# Patient Record
Sex: Female | Born: 1955 | Race: White | Hispanic: No | Marital: Married | Smoking: Former smoker
Health system: Southern US, Community
[De-identification: ages and names within clinical notes are randomized; demographics above are authoritative.]

## PROBLEM LIST (undated history)

## (undated) DIAGNOSIS — M1990 Unspecified osteoarthritis, unspecified site: Secondary | ICD-10-CM

## (undated) DIAGNOSIS — K219 Gastro-esophageal reflux disease without esophagitis: Secondary | ICD-10-CM

## (undated) DIAGNOSIS — M810 Age-related osteoporosis without current pathological fracture: Secondary | ICD-10-CM

## (undated) DIAGNOSIS — R569 Unspecified convulsions: Secondary | ICD-10-CM

## (undated) DIAGNOSIS — F419 Anxiety disorder, unspecified: Secondary | ICD-10-CM

## (undated) DIAGNOSIS — I499 Cardiac arrhythmia, unspecified: Secondary | ICD-10-CM

## (undated) HISTORY — PX: OOPHORECTOMY: SHX86

## (undated) HISTORY — PX: NASAL SINUS SURGERY: SHX719

## (undated) HISTORY — DX: Gastro-esophageal reflux disease without esophagitis: K21.9

## (undated) HISTORY — DX: Age-related osteoporosis without current pathological fracture: M81.0

## (undated) HISTORY — DX: Unspecified convulsions: R56.9

## (undated) HISTORY — DX: Anxiety disorder, unspecified: F41.9

## (undated) HISTORY — DX: Cardiac arrhythmia, unspecified: I49.9

## (undated) HISTORY — PX: OTHER SURGICAL HISTORY: SHX169

## (undated) HISTORY — DX: Unspecified osteoarthritis, unspecified site: M19.90

## (undated) HISTORY — PX: COLONOSCOPY: SHX174

---

## 1996-03-06 HISTORY — PX: ABDOMINAL HYSTERECTOMY: SHX81

## 1998-02-06 ENCOUNTER — Emergency Department (HOSPITAL_COMMUNITY): Admission: EM | Admit: 1998-02-06 | Discharge: 1998-02-06 | Payer: Self-pay

## 2004-03-31 ENCOUNTER — Other Ambulatory Visit: Admission: RE | Admit: 2004-03-31 | Discharge: 2004-03-31 | Payer: Self-pay | Admitting: Gynecology

## 2004-09-09 ENCOUNTER — Encounter (INDEPENDENT_AMBULATORY_CARE_PROVIDER_SITE_OTHER): Payer: Self-pay | Admitting: Specialist

## 2004-09-09 ENCOUNTER — Ambulatory Visit (HOSPITAL_COMMUNITY): Admission: RE | Admit: 2004-09-09 | Discharge: 2004-09-09 | Payer: Self-pay | Admitting: Obstetrics and Gynecology

## 2007-01-20 ENCOUNTER — Encounter: Admission: RE | Admit: 2007-01-20 | Discharge: 2007-01-20 | Payer: Self-pay | Admitting: Orthopedic Surgery

## 2008-12-23 DIAGNOSIS — Z8669 Personal history of other diseases of the nervous system and sense organs: Secondary | ICD-10-CM

## 2008-12-23 DIAGNOSIS — R1031 Right lower quadrant pain: Secondary | ICD-10-CM | POA: Insufficient documentation

## 2008-12-24 ENCOUNTER — Ambulatory Visit: Payer: Self-pay | Admitting: Internal Medicine

## 2008-12-24 DIAGNOSIS — K589 Irritable bowel syndrome without diarrhea: Secondary | ICD-10-CM | POA: Insufficient documentation

## 2008-12-28 ENCOUNTER — Ambulatory Visit: Payer: Self-pay | Admitting: Internal Medicine

## 2008-12-28 ENCOUNTER — Encounter: Payer: Self-pay | Admitting: Internal Medicine

## 2008-12-29 ENCOUNTER — Encounter: Payer: Self-pay | Admitting: Internal Medicine

## 2008-12-30 ENCOUNTER — Telehealth: Payer: Self-pay | Admitting: Internal Medicine

## 2008-12-30 ENCOUNTER — Encounter: Payer: Self-pay | Admitting: Internal Medicine

## 2009-01-01 ENCOUNTER — Ambulatory Visit: Payer: Self-pay | Admitting: Internal Medicine

## 2009-01-04 ENCOUNTER — Encounter: Payer: Self-pay | Admitting: Internal Medicine

## 2009-01-06 ENCOUNTER — Ambulatory Visit: Payer: Self-pay | Admitting: Internal Medicine

## 2009-01-06 ENCOUNTER — Telehealth: Payer: Self-pay | Admitting: Internal Medicine

## 2009-01-08 ENCOUNTER — Telehealth: Payer: Self-pay | Admitting: Internal Medicine

## 2009-04-28 ENCOUNTER — Encounter: Admission: RE | Admit: 2009-04-28 | Discharge: 2009-04-28 | Payer: Self-pay | Admitting: Orthopedic Surgery

## 2009-06-16 ENCOUNTER — Encounter: Admission: RE | Admit: 2009-06-16 | Discharge: 2009-06-16 | Payer: Self-pay | Admitting: Internal Medicine

## 2009-09-03 HISTORY — PX: TRANSTHORACIC ECHOCARDIOGRAM: SHX275

## 2009-12-24 ENCOUNTER — Ambulatory Visit (HOSPITAL_COMMUNITY): Admission: RE | Admit: 2009-12-24 | Discharge: 2009-12-24 | Payer: Self-pay | Admitting: Family Medicine

## 2010-02-02 ENCOUNTER — Telehealth: Payer: Self-pay | Admitting: Internal Medicine

## 2010-02-04 ENCOUNTER — Ambulatory Visit (HOSPITAL_COMMUNITY)
Admission: RE | Admit: 2010-02-04 | Discharge: 2010-02-04 | Payer: Self-pay | Source: Home / Self Care | Admitting: Internal Medicine

## 2010-02-05 ENCOUNTER — Encounter: Admission: RE | Admit: 2010-02-05 | Discharge: 2010-02-05 | Payer: Self-pay | Admitting: Rheumatology

## 2010-02-07 ENCOUNTER — Telehealth: Payer: Self-pay | Admitting: Internal Medicine

## 2010-02-07 ENCOUNTER — Ambulatory Visit: Payer: Self-pay | Admitting: Internal Medicine

## 2010-02-07 DIAGNOSIS — Z860100 Personal history of colon polyps, unspecified: Secondary | ICD-10-CM | POA: Insufficient documentation

## 2010-02-08 DIAGNOSIS — Z859 Personal history of malignant neoplasm, unspecified: Secondary | ICD-10-CM | POA: Insufficient documentation

## 2010-03-01 ENCOUNTER — Ambulatory Visit (HOSPITAL_COMMUNITY)
Admission: RE | Admit: 2010-03-01 | Discharge: 2010-03-01 | Payer: Self-pay | Source: Home / Self Care | Attending: Internal Medicine | Admitting: Internal Medicine

## 2010-03-01 ENCOUNTER — Encounter: Payer: Self-pay | Admitting: Internal Medicine

## 2010-03-02 ENCOUNTER — Encounter: Payer: Self-pay | Admitting: Internal Medicine

## 2010-03-03 ENCOUNTER — Encounter (INDEPENDENT_AMBULATORY_CARE_PROVIDER_SITE_OTHER): Payer: Self-pay | Admitting: *Deleted

## 2010-03-10 ENCOUNTER — Telehealth: Payer: Self-pay | Admitting: Internal Medicine

## 2010-03-28 ENCOUNTER — Encounter: Payer: Self-pay | Admitting: Internal Medicine

## 2010-04-05 NOTE — Procedures (Signed)
Summary: Instructions for procedure/[place]  Instructions for procedure/[place]   Imported By: Sherian Rein 02/10/2010 08:24:27  _____________________________________________________________________  External Attachment:    Type:   Image     Comment:   External Document

## 2010-04-05 NOTE — Progress Notes (Signed)
Summary: Questions  Phone Note Call from Patient Call back at Home Phone [phone]   Caller: Patient Call For: Dr. Juanda Chance Reason for Call: Talk to Nurse Details for Reason: Previous Colon ? Summary of Call: Pt. called about colon done in 2010. Questions if a biopsy was done and if there was a diagnosis of Chron's Disease. Please call and advise. Initial call taken by: Schuyler Amor,  February 02, 2010 4:36 PM  Follow-up for Phone Call        Patient called and questions if her pathology report mentioned crohns disease from her colonoscopy. 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 Patient had flex/sig scheduled for follow-up 1-2 months later but cancelled this appointment. Patient states she was told she did not need to keep this appointment. Please advise. Selinda Michaels, RN Follow-up by: Jesse Fall RN,  February 02, 2010 4:58 PM  Additional Follow-up for Phone Call Additional follow up Details #1::        There was no Croh n's disease on colon biopsies. 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 I advised her to make OV in 3 months for follow up and possible repeat urine collection but none of that happened. She ought to be seen in the office so we can reconnect. Additional Follow-up by: Hart Carwin MD,  February 02, 2010 10:30 PM    Additional Follow-up for Phone Call Additional follow up Details #2::    Patient notified of Dr. Regino Schultze recommendations. Scheduled patient for 02/07/10 at 3:30 PM with Dr. Juanda Chance. 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

## 2010-04-05 NOTE — Letter (Signed)
Summary: [facility]  [address] [place], [place] [address]   Phone: [phone]  Fax: [phone]       ANGLES TREVIZO    03-04-1956    MRN: [id number]        Procedure Day /Date: Tuesday 03/01/10     Arrival Time: 8:00 am     Procedure Time: 9:00 am     Location of Procedure:                     _x _  [facility] ( Outpatient Registration)   PREPARATION FOR FLEXIBLE SIGMOIDOSCOPY WITH MAGNESIUM CITRATE  Prior to the day before your procedure, purchase two Fleet Enemas from the laxative section of your drugstore.  _________________________________________________________________________________________________  THE DAY BEFORE YOUR PROCEDURE             DATE: 02/28/10    DAY: Monday  1.   Have a clear liquid dinner the night before your procedure.  2.   Do not drink anything colored red or purple.  Avoid juices with pulp.  No orange juice.              CLEAR LIQUIDS INCLUDE: Water Jello Ice Popsicles Tea (sugar ok, no milk/cream) Powdered fruit flavored drinks Coffee (sugar ok, no milk/cream) Gatorade Juice: apple, white grape, white cranberry  Lemonade Clear bullion, consomm, broth Carbonated beverages (any kind) Strained chicken noodle soup Hard Candy    3.   Drink at least 3 glasses of clear liquids before bedtime (preferably juices).    ___________________________________________________________________________________________________  THE DAY OF YOUR PROCEDURE            DATE:  03/01/10 DAY: Tuesdsay  1.   Use one Fleet Enema two hours prior to coming for procedure.  2.   Use one Fleet Enema one hour prior to coming for your procedure.  2.   You may drink clear liquids until 5:00 am (4 hours before exam)       MEDICATION INSTRUCTIONS  Unless otherwise instructed, you should take regular prescription medications with a small sip of water as early as possible the morning of your procedure.    OTHER INSTRUCTIONS  You will need a responsible adult at least 55 years of age to accompany you and drive you home.   This person must remain in the waiting room during your procedure.  Wear loose fitting clothing that is easily removed.  Leave jewelry and other valuables at home.  However, you may wish to bring a book to read or an iPod/MP3 player to listen to music as you wait for your procedure to start.  Remove all body piercing jewelry and leave at home.  Total time from sign-in until discharge is approximately 2-3 hours.  You should go home directly after your procedure and rest.  You can resume normal activities the day after your procedure.  The day of your procedure you should not:   Drive   Make legal decisions   Operate machinery   Drink alcohol   Return to work  You will receive specific instructions about eating, activities and medications before you leave.   The above instructions have been reviewed and explained to me by   _______________________    I fully understand and can verbalize these instructions _____________________________ Date _________

## 2010-04-06 HISTORY — PX: NM MYOCAR PERF WALL MOTION: HXRAD629

## 2010-04-07 NOTE — Assessment & Plan Note (Signed)
Summary: f/u carcinoid tumor/[name]   History of Present Illness Visit Type: Follow-up Visit Primary GI MD: Lina Sar MD Primary Provider: Darryll Capers, MD Chief Complaint: Discuss carcinoid tumor History of Present Illness:   This is a 55 year old white female with a diagnosis of colon polyps, one with carcinoid tissue found one year ago on colonoscopy. She was scheduled to have the area ablated with argon laser but canceled the procedure. Her 24-hour urine for HIAA was slightly elevated at 6.2. She did not come for a return appointment until now and she is interested in having this followed up on. She has recently been diagnosed with a rapid heart rate by a cardiologist. She also is having intermittent diarrhea. Her weight has increased by 20 pounds since she has stopped smoking.   GI Review of Systems      Denies abdominal pain, acid reflux, belching, bloating, chest pain, dysphagia with liquids, dysphagia with solids, heartburn, loss of appetite, nausea, vomiting, vomiting blood, weight loss, and  weight gain.      Reports diarrhea.     Denies anal fissure, black tarry stools, change in bowel habit, constipation, diverticulosis, fecal incontinence, heme positive stool, hemorrhoids, irritable bowel syndrome, jaundice, light color stool, liver problems, rectal bleeding, and  rectal pain.    Current Medications (verified): 1)  Phenobarbital 60 Mg Tabs (Phenobarbital) .... Take 2 Tablets By Mouth Once Daily  Allergies (verified): 1)  ! Sulfa  Past History:  Past Medical History: Reviewed history from 12/24/2008 and no changes required. Current Problems:  SEIZURES, HX OF (ICD-V12.49) ABDOMINAL PAIN, RIGHT LOWER QUADRANT (ICD-789.03)    Past Surgical History: Nasal Surgery partial Hysterectomy oophorectomy Hysterectomy  Family History: Reviewed history from 12/24/2008 and no changes required. Family History of Diabetes: Father Family History of Heart Disease:  Father  Social History: Reviewed history from 12/24/2008 and no changes required. Alcohol Use - yes-occasional Illicit Drug Use - no Patient is a former smoker. -stopped 1 year ago Patient gets regular exercise.  Review of Systems       The patient complains of arthritis/joint pain, back pain, heart rhythm changes, and shortness of breath.  The patient denies allergy/sinus, anemia, anxiety-new, blood in urine, breast changes/lumps, change in vision, confusion, cough, coughing up blood, depression-new, fainting, fatigue, fever, headaches-new, hearing problems, heart murmur, itching, menstrual pain, muscle pains/cramps, night sweats, nosebleeds, pregnancy symptoms, skin rash, sleeping problems, sore throat, swelling of feet/legs, swollen lymph glands, thirst - excessive , urination - excessive , urination changes/pain, urine leakage, vision changes, and voice change.         Pertinent positive and negative review of systems were noted in the above HPI. All other ROS was otherwise negative.   Vital Signs:  Patient profile:   55 year old female Height:      65 inches Weight:      159.50 pounds BMI:     26.64 Pulse rate:   80 / minute Pulse rhythm:   regular BP sitting:   102 / 68  (left arm) Cuff size:   regular  Vitals Entered By: June McMurray CMA Duncan Dull) (February 07, 2010 3:31 PM)  Physical Exam  General:  Well developed, well nourished, no acute distress. Lungs:  Clear throughout to auscultation. Heart:  Regular rate and rhythm; no murmurs, rubs,  or bruits. Abdomen:  soft nontender abdomen with normoactive bowel sounds Extremities:  No clubbing, cyanosis, edema or deformities noted. Skin:  Intact without significant lesions or rashes. Psych:  Alert and cooperative.  Normal mood and affect.   Impression & Recommendations:  Problem # 1:  IRRITABLE BOWEL SYNDROME (ICD-564.1) Patient has irritable bowel syndrome with predominant diarrhea. There was an incidental finding of a  carcinoid polyp in the rectum. In view of her 24-hour urine collection being slightly elevated for HIAA's we will proceed with a flexible sigmoidoscopy and possible upper ERBE ablation of the carcinoid polyp remnant.  Problem # 2:  COLONIC POLYPS, HX OF (ICD-V12.72) hyperplastic polyp on colon 1 year ago,  Patient Instructions: 1)  You have been scheduled for a flexible sigmoidoscopy with ERBE with limited prep. The purpose of ablation is to remove any residual remnants of the carcinoid polyp in the rectum. We may need to consider a repeat 24-hour urine collection depending on the findings to the sigmoidoscopy. 2)  Copy sent to : Dr Elby Showers 3)  The medication list was reviewed and reconciled.  All changed / newly prescribed medications were explained.  A complete medication list was provided to the patient / caregiver.  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

## 2010-04-07 NOTE — Progress Notes (Signed)
Summary: Questions  Phone Note Call from Patient Call back at Home Phone [phone]   Caller: Patient Call For: Dr. Juanda Chance Reason for Call: Talk to Nurse Summary of Call: Pt is calling to see when she is supposed to come in for her 24 hour urine testing Initial call taken by: [place] Johnson,  March 10, 2010 12:33 PM  Follow-up for Phone Call        left message for patient to call back. I have already spoken to her about this on 02/07/10. See note. Follow-up by: Lamona Curl CMA Duncan Dull),  March 10, 2010 2:47 PM  Additional Follow-up for Phone Call Additional follow up Details #1::        Patient called back to ask about HIAA testing. I explained that she could have it done at any time now (I already discussed this with Patient  on 02/07/10 but she has no recollection because she was sedated on the day of our conversation for her Flex Sig.) Patient states that Dr Juanda Chance seemed like she didnt think patient really needed repeat HIAA. I explained that on the day of her office visit, patient requested HIAA even though Dr Juanda Chance did not think it was necessary. Patient wanted it for "peace of mind" and we explained that we would order the test if she would like but we would like to wait until after flex sig. Flex biopsies came back showing no signs of inflammation or any reminants of carcinoid. Patient states that she will not have the test if we do not think it is necessary. I have advised her that I will again make certain with Dr Juanda Chance that the test is not needed but I think that she is okay not to have the test at this time given the normal flex biopsies Additional Follow-up by: Lamona Curl CMA Duncan Dull),  March 11, 2010 9:22 AM    Additional Follow-up for Phone Call Additional follow up Details #2::    I agree. Repeat HIAA's  is not necessary, since there was no remaining polyp. Follow-up by: Hart Carwin MD,  March 12, 2010 6:40 PM  Additional Follow-up for Phone  Call Additional follow up Details #3:: Details for Additional Follow-up Action Taken: I have left a message with female for the patient to call back. Dottie Nelson-Smith CMA Duncan Dull)  March 14, 2010 8:36 AM   Patient has decided not to have HIAA as per Dr Regino Schultze recommendations. Dottie Nelson-Smith CMA Duncan Dull)  March 14, 2010 9:41 AM

## 2010-04-07 NOTE — Letter (Signed)
Summary: Appt Reminder 2  [facility]  [address] [place], [place] [address]   Phone: [phone]  Fax: [phone]        March 28, 2010 MRN: [id number]    [facility] [address] [place], [place]  [address]    Dear Ms. Zielinski,   You have a return appointment with Dr. Juanda Chance on 05-02-10 at 3:30pm.  Please remember to bring a complete list of the medicines you are taking, your insurance card and your co-pay.  If you have to cancel or reschedule this appointment, please call before 5:00 pm the evening before to avoid a cancellation fee.  If you have any questions or concerns, please call [phone].    Sincerely,  [employer]

## 2010-04-07 NOTE — Procedures (Signed)
Summary: Flexible Sigmoidoscopy  Patient: [name] Note: All result statuses are Final unless otherwise noted.  Tests: (1) Flexible Sigmoidoscopy (FLX)  FLX Flexible Sigmoidoscopy                             DONE     [facility]     [address] [place], [place]  [address]           FLEXIBLE SIGMOIDOSCOPY PROCEDURE REPORT           PATIENT:  [name], [name]  MR#:  [id number]     BIRTHDATE:  [birth year], 54 yrs. old  GENDER:  female           ENDOSCOPIST:  Hedwig Morton. Juanda Chance, MD     Referred by:  Stacie Glaze, M.D.           PROCEDURE DATE:  03/01/2010     PROCEDURE:  Flexible sigmoidoscopy with APC ablation of lesion     ASA CLASS:  Class I     INDICATIONS:  carcinoid polypremoved on colonosdcopy 12/2008 from     12 cm,     mild elevation of 24hr urine HIAA's,     this is another look           MEDICATIONS:   Versed 5 mg, Fentanyl 75 mcg           DESCRIPTION OF PROCEDURE:   After the risks benefits and     alternatives of the procedure were thoroughly explained, informed     consent was obtained.  Digital rectal exam was performed and     revealed no rectal masses.   The  endoscope was introduced through     the anus and advanced to the descending colon, without     limitations.  The quality of the prep was good.  The instrument     was then slowly withdrawn as the mucosa was fully examined.     <<PROCEDUREIMAGES>>           A scar was found in the sigmoid colon. at 12 cm small flat scar     from postpolypectomy, Multiple biopsies were obtained and sent to     pathology. Bipolar cautery was performed with a 7 fr probe (see     image5 and image4). scar ablated with a bipolar electrode after     biopsies taken     no other lesions seen   Retroflexed views in the rectum revealed     no abnormalities.    The scope was then withdrawn from the patient     and the procedure terminated.           COMPLICATIONS:  None           ENDOSCOPIC IMPRESSION:     1)  Scar in the sigmoid colon     postpolypectomy scar tissue at 12 cm, suspected site of the     carcinoid polyp, s/p re-biopsies and ablation with a bipolar     electrode     RECOMMENDATIONS:     1) await biopsy results           REPEAT EXAM:  In 0 year(s) for.  norecall flex sigm unless     biopsies show recurrent carcinoid           ______________________________     Hedwig Morton. Juanda Chance, MD  CC:           n.     eSIGNED:   Reyanna Baley M. Monroe Toure at 03/01/2010 10:02 AM           [place]man Key, [id number]  Note: An exclamation mark (!) indicates a result that was not dispersed into the flowsheet. Document Creation Date: 03/01/2010 10:01 AM _______________________________________________________________________  (1) Order result status: Final Collection or observation date-time: 03/01/2010 09:53 Requested date-time:  Receipt date-time:  Reported date-time:  Referring Physician:   Ordering Physician: Lina Sar [phone]) Specimen Source:  Source: Launa Grill Order Number: [id number] Lab site:

## 2010-04-07 NOTE — Miscellaneous (Signed)
  Clinical Lists Changes  Observations: Added new observation of FLEX SIGMOID: 03/01/2010 (03/03/2010 8:38) Added new observation of COLONNXTDUE: 02/2015 (03/03/2010 8:38)

## 2010-04-07 NOTE — Progress Notes (Signed)
Summary: Patient ? for Dr Juanda Chance  Phone Note Outgoing Call   Call placed by: Lamona Curl CMA Duncan Dull),  February 07, 2010 6:21 PM Call placed to: Patient Summary of Call: I called patient to let her know that I spoke with Dr Juanda Chance (at patient's request) regarding any possibility of Crohn's disease. Per Dr Juanda Chance, there was no indication on patient's colonoscopy last year of any inflammation or Crohns. Patient verbalizes understanding. She questions whether she she should go ahead with the urine test (HIAA) even though Dr Juanda Chance doesnt really think she needs it right now. Patient states that she would like to do the HIAA for her own peace of mind. Per Dr Juanda Chance, this is okay for her to have. However, she would like for patient to wait at least 24-48 hours after her sigmoidoscopy with possible ERBE to complete this as prep and sedation may alter the results. I will contact [employer] regarding possible dietary restrictions before the test as well. Patient verbalizes understanding of the above and knows that I will call her closer to 03/03/10 to discuss HIAA collection to her. She will call back with questions if she has any. Initial call taken by: Lamona Curl CMA Duncan Dull),  February 07, 2010 6:25 PM     Appended Document: Patient ? for Dr Juanda Chance Left message on patient's voicemail to call back regarding collection of her Urine for HIAA.  Appended Document: Patient ? for Dr Juanda Chance Advised patient that we should wait until the beginning of next week for her urine for HIAA as [facility] recommends the test be done at least 72 hours after taking any medication that may affect metabolism of seritonin (patient just had sedation for sigmoidoscopy today). I have also given her a list of foods to avoid during that 72 hour period (avocados, bananas, eggplant, pineapple, plums, tomatoes and walnuts). Patient verbalizes understanding and states that Dr Juanda Chance had also mentioned wanting to get the  pathology back before the urine HIAA as well.

## 2010-04-07 NOTE — Letter (Signed)
Summary: Patient Notice- Polyp Results  [facility]  [address] [place], [place] [address]   Phone: [phone]  Fax: [phone]        March 02, 2010 MRN: [id number]    [facility] [address] [place], [place]  [address]    Dear [name],  I am pleased to inform you that the colon polyp(s) removed during your recent colonoscopy was (were) found to be benign (no cancer detected) upon pathologic examination.The biopsies show normal colon tissue, no evidence of carcinoid tissue  I recommend you have a repeat colonoscopy examination in 5_ years to look for recurrent polyps, as having colon polyps increases your risk for having recurrent polyps or even colon cancer in the future.  Should you develop new or worsening symptoms of abdominal pain, bowel habit changes or bleeding from the rectum or bowels, please schedule an evaluation with either your primary care physician or with me.  Additional information/recommendations:  __ No further action with gastroenterology is needed at this time. Please      follow-up with your primary care physician for your other healthcare      needs.  __ Please call [phone] to schedule a return visit to review your      situation.  __ Please keep your follow-up visit as already scheduled.  __ Continue treatment plan as outlined the day of your exam.  Please call us if you are having persistent problems or have questions about your condition that have not been fully answered at this time.  Sincerely,  Hart Carwin MD  This letter has been electronically signed by your physician.  Appended Document: Patient Notice- Polyp Results Mailed to patient's home. Recall in [facility] for 5 years.

## 2010-05-02 ENCOUNTER — Encounter: Payer: Self-pay | Admitting: Internal Medicine

## 2010-05-02 ENCOUNTER — Ambulatory Visit (INDEPENDENT_AMBULATORY_CARE_PROVIDER_SITE_OTHER): Payer: Managed Care, Other (non HMO) | Admitting: Internal Medicine

## 2010-05-02 DIAGNOSIS — K589 Irritable bowel syndrome without diarrhea: Secondary | ICD-10-CM

## 2010-05-12 NOTE — Assessment & Plan Note (Signed)
Summary: Abdominal pain   History of Present Illness Visit Type: Follow-up Visit Primary GI MD: Lina Sar MD Primary Provider: Lonni Fix, MD Chief Complaint: FU episode of IBS flare in Jan. 2012, some mucous in stools History of Present Illness:   This is a 55 year old white female with a history of irritable bowel syndrome, bloating and incidental finding of rectal carcinoid. The polyp was removed and a repeat flexible sigmoidoscopy in December 2011 showed no evidence of recurrent carcinoid. Her initial 24-hour urine collection for HIAA was 6.2 which is slightly above normal of 6.0. She has had 2 episodes of diarrhea recently,  but she is mostly constipated. There is a history of seizures controlled with phenobarbital and most recently with Dilantin. She is quite distressed about  loss of memory and is worrying about the long-term effects of phenobarbital which she has taken since the age of 34. She has been followed by a neurologist. She was recently told to have fibromyalgia.   GI Review of Systems    Reports acid reflux.      Denies abdominal pain, belching, bloating, chest pain, dysphagia with liquids, dysphagia with solids, heartburn, loss of appetite, nausea, vomiting, vomiting blood, weight loss, and  weight gain.      Reports irritable bowel syndrome.     Denies anal fissure, black tarry stools, change in bowel habit, constipation, diarrhea, diverticulosis, fecal incontinence, heme positive stool, hemorrhoids, jaundice, light color stool, liver problems, rectal bleeding, and  rectal pain.    Current Medications (verified): 1)  Dilantin 100 Mg Caps (Phenytoin Sodium Extended) .... Take 2 Caps By Mouth Once Daily  Allergies (verified): 1)  ! Sulfa  Past History:  Past Medical History: Reviewed history from 12/24/2008 and no changes required. Current Problems:  SEIZURES, HX OF (ICD-V12.49) ABDOMINAL PAIN, RIGHT LOWER QUADRANT (ICD-789.03)    Past Surgical  History: Reviewed history from 02/07/2010 and no changes required. Nasal Surgery partial Hysterectomy oophorectomy Hysterectomy  Family History: Reviewed history from 12/24/2008 and no changes required. Family History of Diabetes: Father Family History of Heart Disease: Father  Social History: Reviewed history from 12/24/2008 and no changes required. Alcohol Use - yes-occasional Illicit Drug Use - no Patient is a former smoker. -stopped 1 year ago Patient gets regular exercise.  Review of Systems       The patient complains of arthritis/joint pain, back pain, and urination - excessive.  The patient denies allergy/sinus, anemia, anxiety-new, blood in urine, breast changes/lumps, change in vision, confusion, cough, coughing up blood, depression-new, fainting, fatigue, fever, headaches-new, hearing problems, heart murmur, heart rhythm changes, itching, menstrual pain, muscle pains/cramps, night sweats, nosebleeds, pregnancy symptoms, shortness of breath, skin rash, sleeping problems, sore throat, swelling of feet/legs, swollen lymph glands, thirst - excessive , urination - excessive , urination changes/pain, urine leakage, vision changes, and voice change.         Pertinent positive and negative review of systems were noted in the above HPI. All other ROS was otherwise negative.   Vital Signs:  Patient profile:   55 year old female Height:      65 inches Weight:      161.50 pounds BMI:     26.97 Pulse rate:   68 / minute Pulse rhythm:   regular BP sitting:   120 / 78  (left arm) Cuff size:   regular  Vitals Entered By: June McMurray CMA Duncan Dull) (May 02, 2010 4:19 PM)  Physical Exam  General:  Well developed, well nourished, no acute distress.  Eyes:  PERRLA, no icterus. Mouth:  No deformity or lesions, dentition normal. Neck:  Supple; no masses or thyromegaly. Lungs:  Clear throughout to auscultation. Heart:  Regular rate and rhythm; no murmurs, rubs,  or  bruits. Abdomen:  soft diffusely tender more so in the left lower quadrant, upper abdomen are unremarkable. Liver edge at costal margin Extremities:  No clubbing, cyanosis, edema or deformities noted. Skin:  Intact without significant lesions or rashes. Psych:  Alert and cooperative. Normal mood and affect.   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 There was been no recurrence of carcinoid on her last sigmoidoscopy in December 2011.consider Octrotide scan if symptoms continue.  Problem # 2:  IRRITABLE BOWEL SYNDROME (ICD-564.1) Patient has irritable bowel syndrome with predominant constipation, although she does have occasional bouts of diarrhea. We will have her to begin  Bentyl 10 mg q.a.m. I have also given her Align samples and Metamucil samples.  Problem # 3:  SEIZURES, HX OF (ICD-V12.49) Patietn is followed by Dr Corky Crafts. She is on Phenobarbital and most recently on Dilantin. She has had some loss of memory.  Patient Instructions: 1)  We have given you samples of Align to take 1 capsule by mouth once daily. This puts good bacteria into your intestines. If this works well for you, it can be purchased over the counter.   2)  Please pick up your prescriptions at the pharmacy. Electronic prescription(s) has already been sent for Bentyl 10 mg. You should take 1 tablet by mouth every morning.  3)  Take Metamucil 1 heaping teaspoon dissolved in water/juice and drink once daily. 4)   Consider repeat 24 hr urine HIAA and an Octrotide scan if Sx's continue. 5)  The medication list was reviewed and reconciled.  All changed / newly prescribed medications were explained.  A complete medication list was provided to the patient / caregiver. Prescriptions: BENTYL 10 MG CAPS (DICYCLOMINE HCL) Take 1 capsule by mouth every morning  #30 x 2   Entered by:   Lamona Curl CMA (AAMA)   Authorized by:   Hart Carwin MD   Signed by:   Lamona Curl CMA (AAMA) on 05/02/2010   Method used:   Electronically to        [facility]  [phone] (retail)       [address]       [place], [place]  [address]       Ph: [phone]       Fax: [phone]   RxID:   [id number]

## 2010-07-22 NOTE — Op Note (Signed)
[name], [name]                ACCOUNT NO.:  [id number]   MEDICAL RECORD NO.:  [id number]          PATIENT TYPE:  AMB   LOCATION:  SDC                           FACILITY:  [facility]   PHYSICIAN:  Carrington Clamp, M.D. DATE OF BIRTH:  [birth year]   DATE OF PROCEDURE:  09/09/2004  DATE OF DISCHARGE:                                 OPERATIVE REPORT   PREOPERATIVE DIAGNOSES:  Bilateral complex ovarian cysts with right lower  quadrant pain.   POSTOPERATIVE DIAGNOSES:  Bilateral complex ovarian cysts with right lower  quadrant pain.   PROCEDURE:  Diagnostic laparoscopic with bilateral salpingo-oophorectomy.   SURGEON:  Dr. Carrington Clamp   ASSISTANT:  Dr. Conley Simmonds   ANESTHESIA:  General.   SPECIMENS:  Right and left salpinx and ovaries.   ESTIMATED BLOOD LOSS:  Minimal.   IV FLUIDS:   URINE OUTPUT:  Not measured.   COMPLICATIONS:  None.   FINDINGS:  Bilateral ovaries appeared grossly normal.  The cysts that had  been seen on ultrasound were much smaller at this point, and there were no  adhesions or excrescences on the ovaries.  There was a small paratubal cyst  on the left-hand side but no other obvious pathology.  There was no  endometriosis seen.  The cul-de-sac and the vaginal cuff were completely  clear.  There was a normal appendix.  There was a small amount of blood  overlying the omentum just below the trocar.  This area was inspected three  times.  There was no additional bleeding.  There were no areas of puncture  or any other suspicion.  There was no aspiration of bowel contents or blood  on insertion of the Veress needle.  This area was completely stable and free  of lesions.   TECHNIQUE:  After adequate general anesthesia was achieved, the patient was  prepped and draped in the usual sterile fashion in the dorsolithotomy  position.  A sponge-stick was placed in the vagina, and the bladder was  drained with a red rubber catheter during the entire case.   Attention was  turned to the abdomen where an infraumbilical incision of 2 cm was made with  the scalpel.  The Veress needle was passed into the abdomen without  aspiration of bowel contents or blood.  The abdomen was insufflated and the  10 mm trocar passed without complication.  The camera was passed into the  abdomen, and two incisions were made to the right and left side outside of  the inguinal canal with the scalpel.  A 5 mm trocar was placed into each of  these incision sites.   Careful inspection of all areas in the pelvis that could be seen was  performed, and everything looked clean.  The pelvis and abdominal surfaces  were clean and free of any lesions or debris.  The ureters were identified  bilaterally and could be seen well out of the field of dissection for both  ovaries.  The polar instrument was introduced into the abdomen, and the  infundibulopelvic ligament was cauterized with the help of traction on the  ovary with an atraumatic grasper.  After cautery in two places, the ligament  was cut and continued cautery and incision was used to remove the ovary off  of the broad ligament and also the round ligament.  The entire dissection  was performed away from the pelvic sidewall and far out of the way of the  ureter or any other vessel structures.  The right ovary was then placed in  the cul-de-sac, and attention was turned to the left ovary which was removed  in the same exact way.  Again, the ureters could be identified, both before  and after the procedure and well out of the field of dissection.  Scopes  were switched, and the 5 mm scope was placed through the right incision and  the Endobag was placed through the 10 mm trocar incision.  The left ovary  was placed in the bag followed by the right ovary.  The bag was brought up  through the incision and cut open on the outside of the body.  The right  ovary was able to be removed in essentially two pieces followed by the  left  ovary in one piece.  Both specimens were sent to pathology in separate  containers, as both were able to be identified as the right and left  respective ovaries.   The abdomen and pelvis were then inspected and found to be hemostatic.  All  instruments were then withdrawn from the abdomen and the abdomen  desufflated.  The 10 mm trocar fascia site was closed with a figure-of-eight  stitch of 2-0 Vicryl.  The suprapubic incisions, right and left, were closed  subcuticularly with a single stitch of 3-0 Vicryl Rapide.  Incisions were  closed with Dermabond.  All instruments were withdrawn from the vagina, and  the patient tolerated the procedure well and was returned to the recovery  room in stable condition.       MH/MEDQ  D:  09/09/2004  T:  09/09/2004  Job:  [id number]

## 2010-08-10 ENCOUNTER — Other Ambulatory Visit: Payer: Self-pay | Admitting: Internal Medicine

## 2010-11-15 ENCOUNTER — Other Ambulatory Visit: Payer: Self-pay | Admitting: Internal Medicine

## 2010-11-30 ENCOUNTER — Other Ambulatory Visit: Payer: Self-pay | Admitting: Orthopedic Surgery

## 2010-11-30 DIAGNOSIS — N281 Cyst of kidney, acquired: Secondary | ICD-10-CM

## 2010-12-01 ENCOUNTER — Ambulatory Visit
Admission: RE | Admit: 2010-12-01 | Discharge: 2010-12-01 | Disposition: A | Discharge status: Home / Self Care | Payer: Managed Care, Other (non HMO) | Source: Ambulatory Visit | Attending: Orthopedic Surgery | Admitting: Orthopedic Surgery

## 2010-12-01 DIAGNOSIS — N281 Cyst of kidney, acquired: Secondary | ICD-10-CM

## 2010-12-08 DIAGNOSIS — R569 Unspecified convulsions: Secondary | ICD-10-CM

## 2010-12-08 DIAGNOSIS — R413 Other amnesia: Secondary | ICD-10-CM

## 2010-12-14 DIAGNOSIS — R569 Unspecified convulsions: Secondary | ICD-10-CM

## 2010-12-14 DIAGNOSIS — R413 Other amnesia: Secondary | ICD-10-CM

## 2011-02-06 ENCOUNTER — Other Ambulatory Visit: Payer: Self-pay | Admitting: Internal Medicine

## 2011-04-21 ENCOUNTER — Other Ambulatory Visit: Payer: Self-pay | Admitting: *Deleted

## 2011-04-21 ENCOUNTER — Encounter: Payer: Self-pay | Admitting: Internal Medicine

## 2011-04-21 ENCOUNTER — Ambulatory Visit (INDEPENDENT_AMBULATORY_CARE_PROVIDER_SITE_OTHER)
Admission: RE | Admit: 2011-04-21 | Discharge: 2011-04-21 | Disposition: A | Discharge status: Home / Self Care | Payer: Managed Care, Other (non HMO) | Source: Ambulatory Visit | Attending: Internal Medicine | Admitting: Internal Medicine

## 2011-04-21 ENCOUNTER — Ambulatory Visit (INDEPENDENT_AMBULATORY_CARE_PROVIDER_SITE_OTHER): Payer: Managed Care, Other (non HMO) | Admitting: Internal Medicine

## 2011-04-21 VITALS — BP 144/80 | HR 92 | Temp 98.2°F | Resp 16 | Ht 65.0 in | Wt 154.0 lb

## 2011-04-21 DIAGNOSIS — M5416 Radiculopathy, lumbar region: Secondary | ICD-10-CM

## 2011-04-21 DIAGNOSIS — G40909 Epilepsy, unspecified, not intractable, without status epilepticus: Secondary | ICD-10-CM

## 2011-04-21 DIAGNOSIS — F909 Attention-deficit hyperactivity disorder, unspecified type: Secondary | ICD-10-CM

## 2011-04-21 DIAGNOSIS — IMO0002 Reserved for concepts with insufficient information to code with codable children: Secondary | ICD-10-CM

## 2011-04-21 DIAGNOSIS — G4730 Sleep apnea, unspecified: Secondary | ICD-10-CM

## 2011-04-21 MED ORDER — METHYLPREDNISOLONE (PAK) 4 MG PO TABS: 4.0000 mg | ORAL_TABLET | Freq: Every day | ORAL | Status: DC

## 2011-04-21 MED ORDER — CYCLOBENZAPRINE HCL 10 MG PO TABS: 10.0000 mg | ORAL_TABLET | Freq: Three times a day (TID) | ORAL | Status: AC | PRN

## 2011-04-21 MED ORDER — CEPHALEXIN 500 MG PO CAPS: 500.0000 mg | ORAL_CAPSULE | Freq: Four times a day (QID) | ORAL | Status: AC

## 2011-04-21 MED ORDER — HYDROCODONE-ACETAMINOPHEN 5-500 MG PO TABS: 1.0000 | ORAL_TABLET | Freq: Three times a day (TID) | ORAL | Status: AC | PRN

## 2011-04-21 NOTE — Progress Notes (Signed)
Subjective:    Patient ID: [name], female    DOB: March 11, 1955, 56 y.o.   MRN: [id number]  HPI Severe pain in the left buttock into the left leg with  Need to left the leg to get into the car No prior hx of lumbar disc disease.  Evaluation in progress for sleep apnea...  Seeing Dr Maple Hudson at [facility]  Needs a sleep study for observed apnea. Has a hx of palpitations seen by [facility] Hx a stress test and carotidis. PFT's for copd. Has quit smoking.  The pt is seen by  Neurology for Seizure.from childhood secondary to trauma.   Review of Systems  Constitutional: Negative for activity change, appetite change and fatigue.  HENT: Negative for ear pain, congestion, neck pain, postnasal drip and sinus pressure.   Eyes: Negative for redness and visual disturbance.  Respiratory: Negative for cough, shortness of breath and wheezing.   Gastrointestinal: Negative for abdominal pain and abdominal distention.  Genitourinary: Negative for dysuria, frequency and menstrual problem.  Musculoskeletal: Positive for myalgias, back pain, joint swelling, arthralgias and gait problem.  Skin: Negative for rash and wound.  Neurological: Negative for dizziness, weakness and headaches.  Hematological: Negative for adenopathy. Does not bruise/bleed easily.  Psychiatric/Behavioral: Negative for sleep disturbance and decreased concentration.   Past Medical History  Diagnosis Date  . Seizures     History   Social History  . Marital Status: Married    Spouse Name: N/A    Number of Children: N/A  . Years of Education: N/A   Occupational History  . Not on file.   Social History Main Topics  . Smoking status: Former Smoker    Quit date: 03/06/2006  . Smokeless tobacco: Not on file  . Alcohol Use: Yes  . Drug Use: No  . Sexually Active: Not on file   Other Topics Concern  . Not on file   Social History Narrative  . No narrative on file    Past Surgical History  Procedure Date   . Nasal sinus surgery   . Abdominal hysterectomy   . Oophorectomy     Family History  Problem Relation Age of Onset  . Diabetes Father   . Heart disease Father     Allergies  Allergen Reactions  . Sulfonamide Derivatives     Current Outpatient Prescriptions on File Prior to Visit  Medication Sig Dispense Refill  . dicyclomine (BENTYL) 10 MG capsule TAKE 1 CAPSULE BY MOUTH EVERY MORNING  30 capsule  2    BP 144/80  Pulse 92  Temp 98.2 F (36.8 C)  Resp 16  Ht 5\' 5"  (1.651 m)  Wt 154 lb (69.854 kg)  BMI 25.63 kg/m2        Objective:   Physical Exam  Nursing note and vitals reviewed. Constitutional: She is oriented to person, place, and time. She appears well-developed and well-nourished. No distress.  HENT:  Head: Normocephalic and atraumatic.  Right Ear: External ear normal.  Left Ear: External ear normal.  Nose: Nose normal.  Mouth/Throat: Oropharynx is clear and moist.  Eyes: Conjunctivae and EOM are normal. Pupils are equal, round, and reactive to light.  Neck: Normal range of motion. Neck supple. No JVD present. No tracheal deviation present. No thyromegaly present.  Cardiovascular: Normal rate, regular rhythm, normal heart sounds and intact distal pulses.   No murmur heard. Pulmonary/Chest: Effort normal and breath sounds normal. She has no wheezes. She exhibits no tenderness.  Abdominal: Soft. Bowel sounds  are normal.  Musculoskeletal: She exhibits no edema and no tenderness.       Radicular back pain on the left  Lymphadenopathy:    She has no cervical adenopathy.  Neurological: She is alert and oriented to person, place, and time. She has normal reflexes. No cranial nerve deficit.  Skin: Skin is warm and dry. She is not diaphoretic.  Psychiatric: She has a normal mood and affect. Her behavior is normal.          Assessment & Plan:  The patient presents to reestablish primary care she has not been her primary care office for over 3 years.  She  has a history of irritable bowel history of carcinoid tumor a history of seizure disorder.  She has a chief complaint today of low back pain which is most probably radicular in nature I suspect she has a lumbar L5 disc rupture.  She was placed on a protocol with steroids muscle relaxants and pain medications a plain film will be obtained today most probably an MRI was required next week she was informed of my clinical impression and with the course might be.  She also has symptomatology that is consistent with sleep apnea reported by her husband that she stops breathing therefore we will obtain a sleep study and she has a referral to Dr. Maple Hudson for followup.  She is seen [place] [facility] for palpitations stress tests have been normal carotid Dopplers were normal and no medication was initiated.  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

## 2011-04-21 NOTE — Telephone Encounter (Signed)
Pt informed about back xray

## 2011-04-21 NOTE — Progress Notes (Signed)
Addended by: Willy Eddy on: 04/21/2011 03:23 PM   Modules accepted: Orders

## 2011-04-21 NOTE — Patient Instructions (Addendum)
Good to [facility] across from [facility] and go to the basement where her x-ray is located any time today for your spine series. I will contact you by Monday afternoon to let you know if an MRI is needed. I'm going to give you a course of prednisone and a muscle relaxant and a pain pill to try to knock out the back pain  I also referred you to the sleep lab to have a sleep apnea study done   Please obtain a copy of the Cardiolite and echocardiogram that was done at [facility]

## 2011-05-02 ENCOUNTER — Encounter: Payer: Self-pay | Admitting: Internal Medicine

## 2011-05-02 ENCOUNTER — Ambulatory Visit (INDEPENDENT_AMBULATORY_CARE_PROVIDER_SITE_OTHER): Payer: Managed Care, Other (non HMO) | Admitting: Internal Medicine

## 2011-05-02 VITALS — BP 122/78 | HR 109 | Ht 65.0 in | Wt 158.6 lb

## 2011-05-02 DIAGNOSIS — Z8669 Personal history of other diseases of the nervous system and sense organs: Secondary | ICD-10-CM

## 2011-05-02 DIAGNOSIS — G4733 Obstructive sleep apnea (adult) (pediatric): Secondary | ICD-10-CM

## 2011-05-02 DIAGNOSIS — K219 Gastro-esophageal reflux disease without esophagitis: Secondary | ICD-10-CM

## 2011-05-02 NOTE — Progress Notes (Signed)
05/02/11- 39 yoF former smoker self-referred for sleep medicine evaluation because of suspected sleep apnea. Husband tells her she snores. She is concerned about daytime lapses of attention and memory. 6 cups of coffee daily. Hx of seizures after blunt head injury age 56. Nocturnal grandmal seizures about once every 6 months, prevented now by current meds. Last seizure was 11/ 2012 when she tried stopping phenobarb.  PCP Dr Lovell Sheehan Psychiatrist diagnosed attention deficit disorder. Restless sleep, toss and turn. Bedtime 11-12 PM, sleep latency less than 5 minutes. Unsure if she wakes up or how often before getting up at 7 to 7:30 AM. No sleep meds. A sister had obstructive sleep apnea with obesity before dying of cancer. ENT surgery-resection of sinus tumor, benign, ethmoid; also septoplasty.  ROS-see HPI Constitutional:   No-   weight loss, night sweats, fevers, chills, fatigue, lassitude. HEENT:   No-  headaches, difficulty swallowing, tooth/dental problems, sore throat,       No-  sneezing, itching, ear ache, nasal congestion, post nasal drip,  CV:  No-   chest pain, orthopnea, PND, swelling in lower extremities, anasarca, dizziness, +palpitations-controlled with Tenormin. Resp: No-   shortness of breath with exertion or at rest.  Can be hard to take a deep breath.            No-   productive cough,  No non-productive cough,  No- coughing up of blood.              No-   change in color of mucus.  No- wheezing.   Skin: No-   rash or lesions. GI:  "Major" heartburn, indigestion,    No-abdominal pain, nausea, vomiting, diarrhea,                 change in bowel habits, loss of appetite GU: MS:  No-   joint pain or swelling.  No- decreased range of motion.  No- back pain. Neuro-     nothing unusual Psych:  No- change in mood or affect. No depression or anxiety.  No memory loss.  OBJ- Physical Exam General- Alert, Oriented, Affect-appropriate, Distress- none acute, bright  Skin- rash-none,  lesions- none, excoriation- none Lymphadenopathy- none Head- atraumatic            Eyes- Gross vision intact, PERRLA, conjunctivae and secretions clear            Ears- Hearing, canals-normal            Nose- Clear, no-Septal dev, mucus, polyps, erosion, perforation             Throat- Mallampati III , mucosa clear , drainage- none, tonsils- atrophic Neck- flexible , trachea midline, no stridor , thyroid nl, carotid no bruit Chest - symmetrical excursion , unlabored           Heart/CV- RRR , no murmur , no gallop  , no rub, nl s1 s2                           - JVD- none , edema- none, stasis changes- none, varices- none           Lung- clear to P&A, wheeze- none, cough- none , dullness-none, rub- none           Chest wall-  Abd- tender-no, distended-no, bowel sounds-present, HSM- no Br/ Gen/ Rectal- Not done, not indicated Extrem- cyanosis- none, clubbing, none, atrophy- none, strength- nl Neuro- grossly intact to observation

## 2011-05-02 NOTE — Patient Instructions (Signed)
We will go over your sleep study with you when you return.

## 2011-05-06 DIAGNOSIS — G4733 Obstructive sleep apnea (adult) (pediatric): Secondary | ICD-10-CM | POA: Insufficient documentation

## 2011-05-06 DIAGNOSIS — K219 Gastro-esophageal reflux disease without esophagitis: Secondary | ICD-10-CM | POA: Insufficient documentation

## 2011-05-06 NOTE — Assessment & Plan Note (Signed)
Consider possibility of sleep related seizures, ongoing.

## 2011-05-06 NOTE — Assessment & Plan Note (Signed)
Appropriate education and discussion done. Plan-schedule sleep study.

## 2011-05-06 NOTE — Assessment & Plan Note (Signed)
Reflux precautions emphasized. Discussed use of acid blockers.

## 2011-05-07 ENCOUNTER — Other Ambulatory Visit: Payer: Self-pay | Admitting: Internal Medicine

## 2011-05-09 ENCOUNTER — Ambulatory Visit (HOSPITAL_BASED_OUTPATIENT_CLINIC_OR_DEPARTMENT_OTHER): Payer: Managed Care, Other (non HMO) | Attending: Internal Medicine | Admitting: Radiology

## 2011-05-09 VITALS — Ht 65.0 in | Wt 152.0 lb

## 2011-05-09 DIAGNOSIS — G4737 Central sleep apnea in conditions classified elsewhere: Secondary | ICD-10-CM | POA: Insufficient documentation

## 2011-05-09 DIAGNOSIS — G4733 Obstructive sleep apnea (adult) (pediatric): Secondary | ICD-10-CM | POA: Insufficient documentation

## 2011-05-09 DIAGNOSIS — G4730 Sleep apnea, unspecified: Secondary | ICD-10-CM

## 2011-05-09 DIAGNOSIS — G40909 Epilepsy, unspecified, not intractable, without status epilepticus: Secondary | ICD-10-CM | POA: Insufficient documentation

## 2011-05-09 DIAGNOSIS — R569 Unspecified convulsions: Secondary | ICD-10-CM

## 2011-05-09 DIAGNOSIS — Z79899 Other long term (current) drug therapy: Secondary | ICD-10-CM | POA: Insufficient documentation

## 2011-05-13 DIAGNOSIS — G40909 Epilepsy, unspecified, not intractable, without status epilepticus: Secondary | ICD-10-CM

## 2011-05-13 DIAGNOSIS — G4737 Central sleep apnea in conditions classified elsewhere: Secondary | ICD-10-CM

## 2011-05-13 DIAGNOSIS — Z79899 Other long term (current) drug therapy: Secondary | ICD-10-CM

## 2011-05-13 DIAGNOSIS — G4733 Obstructive sleep apnea (adult) (pediatric): Secondary | ICD-10-CM

## 2011-05-14 NOTE — Procedures (Signed)
[name], MULLALLY                ACCOUNT NO.:  [id number]  MEDICAL RECORD NO.:  [id number]          PATIENT TYPE:  OUT  LOCATION:  SLEEP CENTER                 [facility]ACILITY:  [facility]  PHYSICIAN:  Maryalice Pasley D. Maple Hudson, MD, FCCP, FACPDATE OF BIRTH:  1956/02/29  DATE OF STUDY:  05/09/2011                           NOCTURNAL POLYSOMNOGRAM  REFERRING PHYSICIAN:  Stacie Glaze, MD  REFERRING PHYSICIAN:  Stacie Glaze, MD  INDICATION FOR STUDY:  Hypersomnia with sleep apnea.  History of seizure disorder.  EPWORTH SLEEPINESS SCORE:  3/24.  BMI 25.3, weight 152 pounds, height 65 inches, neck 13.5 inches.  MEDICATIONS:  Home medications:  Charted and reviewed.  SLEEP ARCHITECTURE:  Total sleep time 278.5 minutes with sleep efficiency 72.4%.  Stage I was 19%, stage II 65.5%, stage III absent, REM 15.4% of total sleep time.  Sleep latency 34.5 minutes, REM latency 210 minutes, awake after sleep onset 75 minutes.  Arousal index 25.2.  BEDTIME MEDICATION:  Phenobarbital, phenytoin.  RESPIRATORY DATA:  Apnea-hypopnea index (AHI) 9.7 per hour.  A total of 45 events was scored including 6 obstructive apneas, 6 central apneas, 33 hypopneas.  Most events were associated with supine sleep position. REM AHI 1.4 per hour.  There were insufficient numbers of events to permit application of split protocol, CPAP titration on this study night.  OXYGEN DATA:  Moderately loud snoring with oxygen desaturation to a nadir of 88% and mean oxygen saturation through the study of 94.3% on room air.  CARDIAC DATA:  Sinus rhythm with occasional PVC and PAC.  MOVEMENT-PARASOMNIA:  No significant movement disturbance.  Bathroom x1. No EEG.  Seizure activity identified.  IMPRESSIONS-RECOMMENDATIONS: 1. Fragmented sleep with frequent brief waking, which may be best     addressed as insomnia. 2. 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  Mostly supine position-related events  with moderately     loud snoring and oxygen desaturation to a nadir of 88% and mean     oxygen saturation of 94.3% through the study on room air. 3. There were insufficient numbers of events to meet protocol     requirements for application of split CPAP     titration on this study night. 4. No seizure activity was seen.  Phenobarbital and phenytoin were taken     at bedtime.     Chizaram Latino D. Maple Hudson, MD, [facility], FACP Diplomate, American Board of Sleep Medicine    CDY/MEDQ  D:  05/13/2011 10:45:31  T:  05/13/2011 23:52:10  Job:  [id number]

## 2011-05-30 ENCOUNTER — Ambulatory Visit (INDEPENDENT_AMBULATORY_CARE_PROVIDER_SITE_OTHER): Payer: Managed Care, Other (non HMO) | Admitting: Internal Medicine

## 2011-05-30 ENCOUNTER — Encounter: Payer: Self-pay | Admitting: Neurology

## 2011-05-30 ENCOUNTER — Encounter: Payer: Self-pay | Admitting: Internal Medicine

## 2011-05-30 VITALS — BP 114/76 | HR 88 | Ht 65.0 in | Wt 159.5 lb

## 2011-05-30 DIAGNOSIS — R569 Unspecified convulsions: Secondary | ICD-10-CM

## 2011-05-30 DIAGNOSIS — J302 Other seasonal allergic rhinitis: Secondary | ICD-10-CM

## 2011-05-30 DIAGNOSIS — Z8669 Personal history of other diseases of the nervous system and sense organs: Secondary | ICD-10-CM

## 2011-05-30 DIAGNOSIS — K219 Gastro-esophageal reflux disease without esophagitis: Secondary | ICD-10-CM

## 2011-05-30 DIAGNOSIS — G4733 Obstructive sleep apnea (adult) (pediatric): Secondary | ICD-10-CM

## 2011-05-30 DIAGNOSIS — J309 Allergic rhinitis, unspecified: Secondary | ICD-10-CM

## 2011-05-30 NOTE — Progress Notes (Signed)
05/02/11- 25 yoF former smoker self-referred for sleep medicine evaluation because of suspected sleep apnea. Husband tells her she snores. She is concerned about daytime lapses of attention and memory. 6 cups of coffee daily. Hx of seizures after blunt head injury age 56. Nocturnal grandmal seizures about once every 6 months, prevented now by current meds. Last seizure was 11/ 2012 when she tried stopping phenobarb.  PCP Dr Lovell Sheehan Psychiatrist diagnosed attention deficit disorder. Restless sleep, toss and turn. Bedtime 11-12 PM, sleep latency less than 5 minutes. Unsure if she wakes up or how often before getting up at 7 to 7:30 AM. No sleep meds. A sister had obstructive sleep apnea with obesity before dying of cancer. ENT surgery-resection of sinus tumor, benign, ethmoid; also septoplasty.  05/30/11- 37 yoF former smoker self-referred for sleep medicine evaluation because of suspected sleep apnea. Husband tells her she snores. She is concerned about daytime lapses of attention and memory. Hx  Sleep seizures, GERD, carcinoid tumor.  Increased nasal congestion , eyes itch. No wheeze or cough.  When she quit smoking she says she gained 30 pounds. No recent recognized seizure. NPSG- fragmented sleep/ insomnia pattern, No seizure. Mild OSA 9.7/hr. We discussed the significance. She does not want CPAP. We discussed conservative treatments including weight loss, sleeping off flat of back.  ROS-see HPI Constitutional:   No-   weight loss, night sweats, fevers, chills, fatigue, lassitude. HEENT:   No-  headaches, difficulty swallowing, tooth/dental problems, sore throat,       No-  sneezing, itching, ear ache,   +nasal congestion, post nasal drip,  CV:  No-   chest pain, orthopnea, PND, swelling in lower extremities, anasarca, dizziness, +palpitations-controlled with Tenormin. Resp: No-   shortness of breath with exertion or at rest.  Can be hard to take a deep breath.            No-   productive cough,   No non-productive cough,  No- coughing up of blood.              No-   change in color of mucus.  No- wheezing.   Skin: No-   rash or lesions. GI:  "Major" heartburn, indigestion,    No-abdominal pain, nausea, vomiting,  GU: MS:  No-   joint pain or swelling.   Neuro-     nothing unusual Psych:  No- change in mood or affect. No depression or anxiety.  No memory loss.  OBJ- Physical Exam General- Alert, Oriented, Affect-appropriate, Distress- none acute, bright  Skin- rash-none, lesions- none, excoriation- none Lymphadenopathy- none Head- atraumatic            Eyes- Gross vision intact, PERRLA, conjunctivae and secretions clear            Ears- Hearing, canals-normal            Nose- Frequent sniffing, Clear, no-Septal dev, mucus, polyps, erosion, perforation             Throat- Mallampati III , mucosa clear , drainage- none, tonsils- atrophic Neck- flexible , trachea midline, no stridor , thyroid nl, carotid no bruit Chest - symmetrical excursion , unlabored           Heart/CV- RRR , no murmur , no gallop  , no rub, nl s1 s2                           - JVD- none , edema- none, stasis changes-  none, varices- none           Lung- clear to P&A, wheeze- none, cough- none , dullness-none, rub- none           Chest wall-  Abd-  Br/ Gen/ Rectal- Not done, not indicated Extrem- cyanosis- none, clubbing, none, atrophy- none, strength- nl Neuro- grossly intact to observation

## 2011-05-30 NOTE — Patient Instructions (Signed)
For the mild sleep apnea- Sleep off flat of back and avoid weight gain  For the Allergic Rhinitis/ conjunctivitis- try first a nonsedating otc antihistamine like Claritin/loratadine, or Allegra/fexofenadine  Order- refer to Neurology Dr Modesto Charon-  Dx nocturnal seizures

## 2011-06-04 DIAGNOSIS — J309 Allergic rhinitis, unspecified: Secondary | ICD-10-CM | POA: Insufficient documentation

## 2011-06-04 DIAGNOSIS — J302 Other seasonal allergic rhinitis: Secondary | ICD-10-CM | POA: Insufficient documentation

## 2011-06-04 NOTE — Assessment & Plan Note (Signed)
Recent exacerbation of rhinitis and conjunctivitis. The timing is consistent with sensitization 2 early spring tree pollens . Plan- environmental precautions were reviewed. Start with over-the-counter antihistamines as needed.

## 2011-06-04 NOTE — Assessment & Plan Note (Signed)
Emphasis on reflux precautions and regular use of acid blocker.

## 2011-06-04 NOTE — Assessment & Plan Note (Signed)
She does not want to consider CPAP. 4 mild obstructive apnea in this range, I suggested emphasis on losing a little weight and trying to sleep off the Phifer back.

## 2011-06-04 NOTE — Assessment & Plan Note (Signed)
She has not recognized recent seizure activity and none was noted on her recent sleep study. She would like neurology evaluation and is being referred to Dr. Modesto Charon

## 2011-06-19 ENCOUNTER — Ambulatory Visit: Payer: Managed Care, Other (non HMO) | Admitting: Internal Medicine

## 2011-07-14 ENCOUNTER — Other Ambulatory Visit: Payer: Self-pay | Admitting: Internal Medicine

## 2011-07-17 ENCOUNTER — Ambulatory Visit: Payer: Managed Care, Other (non HMO) | Admitting: Neurology

## 2011-08-17 ENCOUNTER — Encounter: Payer: Self-pay | Admitting: Internal Medicine

## 2011-08-17 ENCOUNTER — Ambulatory Visit (INDEPENDENT_AMBULATORY_CARE_PROVIDER_SITE_OTHER): Payer: Managed Care, Other (non HMO) | Admitting: Internal Medicine

## 2011-08-17 VITALS — BP 120/78 | HR 72 | Temp 98.3°F | Resp 16 | Ht 65.0 in | Wt 150.0 lb

## 2011-08-17 DIAGNOSIS — G40909 Epilepsy, unspecified, not intractable, without status epilepticus: Secondary | ICD-10-CM

## 2011-08-17 DIAGNOSIS — F988 Other specified behavioral and emotional disorders with onset usually occurring in childhood and adolescence: Secondary | ICD-10-CM

## 2011-08-17 MED ORDER — LISDEXAMFETAMINE DIMESYLATE 30 MG PO CAPS: 30.0000 mg | ORAL_CAPSULE | ORAL | Status: DC

## 2011-08-17 NOTE — Progress Notes (Signed)
Subjective:    Patient ID: [name], female    DOB: 1955-06-20, 56 y.o.   MRN: [id number]  HPI  She is a 56 year old female with a long-standing history of seizure disorder who was on phenobarbital as a single agent but in 2012 her neurologist started her on Dilantin initially because of complaints of the sleep disorder and she was hypersomnolent from the Dilantin they changed it to twice a day dosing.  She's been on both Dilantin and phenobarbital for a year and levels have not been checked until today.  She presents primarily today to discuss symptoms consistent with attention deficit disorder inability to focus and ability to complete tasks and since she has applied for a new job she is concerned that this will effect her employability.  She denies any seizure activity as tolerated medicines generally well except for some sense of fatigue as well as persistent insomnia. She describes her insomnia has difficulty falling asleep when she is asleep she does well  Review of Systems  Constitutional: Positive for fatigue. Negative for activity change and appetite change.  HENT: Negative for ear pain, congestion, neck pain, postnasal drip and sinus pressure.   Eyes: Negative for redness and visual disturbance.  Respiratory: Negative for cough, shortness of breath and wheezing.   Gastrointestinal: Negative for abdominal pain and abdominal distention.  Genitourinary: Negative for dysuria, frequency and menstrual problem.  Musculoskeletal: Negative for myalgias, joint swelling and arthralgias.  Skin: Negative for rash and wound.  Neurological: Negative for dizziness, seizures, weakness and headaches.  Hematological: Negative for adenopathy. Does not bruise/bleed easily.  Psychiatric/Behavioral: Negative for disturbed wake/sleep cycle and decreased concentration.       Past Medical History  Diagnosis Date  . Seizures   . Abnormal heart rhythms   . Osteoarthritis     History    Social History  . Marital Status: Married    Spouse Name: N/A    Number of Children: 2  . Years of Education: N/A   Occupational History  . quit job-illness;was property mgmt    Social History Main Topics  . Smoking status: Former Smoker -- 2.5 packs/day for 35 years    Types: Cigarettes    Quit date: 03/06/2008  . Smokeless tobacco: Not on file  . Alcohol Use: No  . Drug Use: No  . Sexually Active: Not on file   Other Topics Concern  . Not on file   Social History Narrative  . No narrative on file    Past Surgical History  Procedure Date  . Nasal sinus surgery   . Abdominal hysterectomy   . Oophorectomy   . Cyst removed     right wrist    Family History  Problem Relation Age of Onset  . Diabetes Father   . Heart disease Father   . Cancer Sister     unknow type-kind was shutting organs down  . Sleep apnea Sister   . Asthma Brother     bronchitis  . Rheum arthritis Mother     Allergies  Allergen Reactions  . Sulfonamide Derivatives     Current Outpatient Prescriptions on File Prior to Visit  Medication Sig Dispense Refill  . atenolol (TENORMIN) 25 MG tablet Take 1 tablet by mouth daily.      Marland Kitchen PHENobarbital (LUMINAL) 64.8 MG tablet Take 1 tablet by mouth daily.      . phenytoin (DILANTIN) 100 MG ER capsule Take 200 mg by mouth daily.      Marland Kitchen  Probiotic Product (ALIGN) 4 MG CAPS Take by mouth daily.      Marland Kitchen lisdexamfetamine (VYVANSE) 30 MG capsule Take 1 capsule (30 mg total) by mouth every morning.  30 capsule  0    BP 120/78  Pulse 72  Temp 98.3 F (36.8 C)  Resp 16  Ht 5\' 5"  (1.651 m)  Wt 150 lb (68.04 kg)  BMI 24.96 kg/m2    Objective:   Physical Exam  Constitutional: She is oriented to person, place, and time. She appears well-developed and well-nourished. No distress.  HENT:  Head: Normocephalic and atraumatic.  Right Ear: External ear normal.  Left Ear: External ear normal.  Nose: Nose normal.  Mouth/Throat: Oropharynx is clear and  moist.  Eyes: Conjunctivae and EOM are normal. Pupils are equal, round, and reactive to light.  Neck: Normal range of motion. Neck supple. No JVD present. No tracheal deviation present. No thyromegaly present.  Cardiovascular: Normal rate, regular rhythm, normal heart sounds and intact distal pulses.   No murmur heard. Pulmonary/Chest: Effort normal and breath sounds normal. She has no wheezes. She exhibits no tenderness.  Abdominal: Soft. Bowel sounds are normal.  Musculoskeletal: Normal range of motion. She exhibits no edema and no tenderness.  Lymphadenopathy:    She has no cervical adenopathy.  Neurological: She is alert and oriented to person, place, and time. She has normal reflexes. No cranial nerve deficit.  Skin: Skin is warm and dry. She is not diaphoretic.  Psychiatric: She has a normal mood and affect. Her behavior is normal.          Assessment & Plan:  History seizure disorder on 3 drugs stroke levels will be obtained today.  History of adult deficit disorder plan to do a trial of long-acting class drug   vvyance  Prescription given to patient for trial followup in 30 days

## 2011-08-17 NOTE — Patient Instructions (Addendum)
The patient is instructed to continue all medications as prescribed. Schedule followup with check out clerk upon leaving the clinic  

## 2011-08-18 LAB — PHENYTOIN LEVEL, TOTAL: Phenytoin Lvl: 8.7 ug/mL — ABNORMAL LOW (ref 10.0–20.0)

## 2011-08-24 ENCOUNTER — Encounter: Payer: Self-pay | Admitting: Neurology

## 2011-08-24 ENCOUNTER — Ambulatory Visit (INDEPENDENT_AMBULATORY_CARE_PROVIDER_SITE_OTHER): Payer: Managed Care, Other (non HMO) | Admitting: Neurology

## 2011-08-24 VITALS — BP 112/78 | HR 76 | Ht 65.0 in | Wt 151.0 lb

## 2011-08-24 DIAGNOSIS — G40909 Epilepsy, unspecified, not intractable, without status epilepticus: Secondary | ICD-10-CM

## 2011-08-24 MED ORDER — PHENOBARBITAL 64.8 MG PO TABS: 129.6000 mg | ORAL_TABLET | Freq: Every day | ORAL | Status: DC

## 2011-08-24 NOTE — Progress Notes (Signed)
Dear Dr. Lovell Sheehan,  Thank you for having me see [name] in consultation today at [facility] for her problem with epilepsy.  As you may recall, she is a 56 y.o. year old female with a history of a childhood head trauma at the age of 62 who developed seizures at that age.  They initially occurred weekly, but since being on phenobarbital from about the age of 69 they have occurred yearly and only during sleep at around 5:00 a.m.  Her current seizures she describes as generalized tonic clonic and she bites her tongue with them.  However, her initial seizures when she was young start with a change in vision in her left eye that she describes as "splitting of vision".  She would then lose consciousness.  Interestingly she still gets these spells of "splitting of vision" that last seconds.  They typically occur a few days before her big nocturnal seizures although they can occur without going on to have a nocturnal convulsive seizures.  The precipitants of her nocturnal convulsive events are usually extreme stress.  Her nocturnal convulsive seizures have occurred about once per year over the last year.  Last June she had her phenobarbital decreased from 128mg  daily to 64mg  daily as she had complained about memory problems.  Phenytoin 200mg  was added in its place.  She has had one convulsive event since then.  She did have NP testing and was thought to have ADHD.  You placed her on Vyvance but she has not noted a change in her cognition since then.  With respect to her cognition she has problems remembering conversations and where she is going.  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  She has been diagnosed with osteopenia based on Dexa scan.  She is not taking calcium + vitamin D.    Her last MRI was in June 2012.  Results are unknown.  Last EEG was as a child.  She also has a recent diagnosis of a  positive ANA thought to be secondary to her use of phenobarbital.      Past Medical History  Diagnosis Date  . Seizures   . Abnormal heart rhythms   . Osteoarthritis     Past Surgical History  Procedure Date  . Nasal sinus surgery   . Abdominal hysterectomy   . Oophorectomy   . Cyst removed     right wrist    History   Social History  . Marital Status: Married    Spouse Name: N/A    Number of Children: 2  . Years of Education: N/A   Occupational History  . quit job-illness;was property mgmt    Social History Main Topics  . Smoking status: Former Smoker -- 2.5 packs/day for 35 years    Types: Cigarettes    Quit date: 03/06/2008  . Smokeless tobacco: Never Used  . Alcohol Use: No  . Drug Use: No  . Sexually Active: None   Other Topics Concern  . None   Social History Narrative  . None    Family History  Problem Relation Age of Onset  . Diabetes Father   . Heart disease Father   . Cancer Sister     unknow type-kind was shutting organs down  . Sleep apnea Sister   . Asthma Brother     bronchitis  . Rheum arthritis Mother   -  no history of seizures.  Current Outpatient Prescriptions on File Prior to Visit  Medication Sig Dispense Refill  . atenolol (TENORMIN) 25 MG tablet Take 1 tablet by mouth daily.      Marland Kitchen lisdexamfetamine (VYVANSE) 30 MG capsule Take 1 capsule (30 mg total) by mouth every morning.  30 capsule  0  . phenytoin (DILANTIN) 100 MG ER capsule Take 200 mg by mouth daily.      Marland Kitchen DISCONTD: PHENobarbital (LUMINAL) 64.8 MG tablet Take 1 tablet by mouth daily.      - phenobarbital 64.8 daily.  Allergies  Allergen Reactions  . Indomethacin   . Sulfonamide Derivatives       ROS:  13 systems were reviewed and  are unremarkable.   Examination:  Filed Vitals:   08/24/11 1024  BP: 112/78  Pulse: 76  Height: 5\' 5"  (1.651 m)  Weight: 151 lb (68.493 kg)    In general, well appearing women.  Cardiovascular: The patient has a regular  rate and rhythm and no carotid bruits.  Fundoscopy:  Disks are flat. Vessel caliber within normal limits.  Mental status:   The patient is oriented to person, place and time. Recent and remote memory are intact. Attention span and concentration are normal. Language including repetition, naming, following commands are intact. Fund of knowledge of current and historical events, as well as vocabulary are normal.  Cranial Nerves: Pupils are equally round and reactive to light. Visual fields full to confrontation. Extraocular movements are intact without nystagmus. Facial sensation and muscles of mastication are intact. Muscles of facial expression are symmetric. Hearing intact to bilateral finger rub. Tongue protrusion, uvula, palate midline.  Shoulder shrug intact  Motor:  The patient has normal bulk and tone, no pronator drift.  There are no adventitious movements.  5/5 muscle strength bilaterally.  Reflexes:   Biceps  Triceps Brachioradialis Knee Ankle  Right 2+  2+  2+   2+ 1+  Left  2+  2+  2+   2+ 1+  Toes down  Coordination:  Normal finger to nose.  No dysdiadokinesia.  Sensation is intact to temperature and vibration and position in toes.  Gait and Station are normal.  Tandem gait is intact.  Romberg is negative   Impression/Recs: 1.  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  Given the decreased phenobarbital has not helped her cognition I would have her go back to her original dose of phenobarbital at 129.6mg  qhs and have her stop the Dilantin.  Dilantin is not a great choice as like phenobarbital it has enzyme inducing properties that affect vitamin D levels.  I have asked her to start taking vitamin D + calcium 2 pills per day.(500mg  calcium + 400u vitamin D).  I would recommend you getting a Dexa scan to monitor her bone health, as she is at risk of osteoporosis being on the phenobarbital.  If it looks like her  osteopenia is getting a lot worse over time we will have to consider switching her to a non-enzyme inducing agent.  I would probably pick Keppra as she has failed Tegretol in the past.  I will continue to follow her at [facility].  There I will get a routine EEG.  I am also going to get the records from [facility].    If she has another seizure  -- which I expect she will I will likely increase her phenobarbital.   We will see the patient back  in 6 months.  Thank you for having [place] see [name] in consultation.  Feel free to contact me with any questions.  Lupita Raider Modesto Charon, MD [facility], [place] [address] [place], [place] [address] Phone: [phone] Fax: [phone].

## 2011-08-28 ENCOUNTER — Telehealth: Payer: Self-pay | Admitting: *Deleted

## 2011-08-28 NOTE — Telephone Encounter (Signed)
Thanks norma 

## 2011-08-28 NOTE — Progress Notes (Signed)
Set up dexa for bone density

## 2011-08-28 NOTE — Telephone Encounter (Signed)
please call pt and give appointment for bone density test at [facility]

## 2011-08-28 NOTE — Telephone Encounter (Signed)
lmom for pt to callback to sch test

## 2011-08-31 NOTE — Telephone Encounter (Signed)
Pt will discuss with dr Lovell Sheehan about bmd

## 2011-09-18 ENCOUNTER — Ambulatory Visit: Payer: Managed Care, Other (non HMO) | Admitting: Internal Medicine

## 2011-09-19 ENCOUNTER — Ambulatory Visit (INDEPENDENT_AMBULATORY_CARE_PROVIDER_SITE_OTHER): Payer: Managed Care, Other (non HMO) | Admitting: Internal Medicine

## 2011-09-19 VITALS — BP 130/80 | HR 72 | Temp 98.6°F | Resp 16 | Ht 65.0 in | Wt 148.0 lb

## 2011-09-19 DIAGNOSIS — F988 Other specified behavioral and emotional disorders with onset usually occurring in childhood and adolescence: Secondary | ICD-10-CM

## 2011-09-19 DIAGNOSIS — F329 Major depressive disorder, single episode, unspecified: Secondary | ICD-10-CM

## 2011-09-19 DIAGNOSIS — G40909 Epilepsy, unspecified, not intractable, without status epilepticus: Secondary | ICD-10-CM

## 2011-09-19 MED ORDER — SERTRALINE HCL 25 MG PO TABS: 25.0000 mg | ORAL_TABLET | Freq: Every day | ORAL | Status: DC

## 2011-09-19 MED ORDER — LISDEXAMFETAMINE DIMESYLATE 50 MG PO CAPS: 50.0000 mg | ORAL_CAPSULE | ORAL | Status: DC

## 2011-09-19 MED ORDER — LISDEXAMFETAMINE DIMESYLATE 30 MG PO CAPS: 30.0000 mg | ORAL_CAPSULE | ORAL | Status: DC

## 2011-11-28 ENCOUNTER — Ambulatory Visit: Payer: Managed Care, Other (non HMO) | Admitting: Internal Medicine

## 2011-11-28 ENCOUNTER — Encounter: Payer: Self-pay | Admitting: Internal Medicine

## 2011-11-28 NOTE — Patient Instructions (Signed)
The patient is instructed to continue all medications as prescribed. Schedule followup with check out clerk upon leaving the clinic  

## 2011-11-28 NOTE — Progress Notes (Signed)
Subjective:    Patient ID: [name], female    DOB: 09-09-1955, 56 y.o.   MRN: [id number]  HPI Patient is a 56 year old female who was recently reestablished with our practice for both her seizure management and for history of sleep apnea GERD and irritable bowel syndrome For many years she was controlled on phenobarbital on that she was seen by a neurologist who added a second agent which is causing multiple side effects including inability to focus and memory disorder.  She also suffers from adult-onset attention deficit disorder and and mild to moderate depression  Review of Systems  Constitutional: Negative for activity change, appetite change and fatigue.  HENT: Negative for ear pain, congestion, neck pain, postnasal drip and sinus pressure.   Eyes: Negative for redness and visual disturbance.  Respiratory: Negative for cough, shortness of breath and wheezing.   Gastrointestinal: Negative for abdominal pain and abdominal distention.  Genitourinary: Negative for dysuria, frequency and menstrual problem.  Musculoskeletal: Negative for myalgias, joint swelling and arthralgias.  Skin: Negative for rash and wound.  Neurological: Negative for dizziness, weakness and headaches.  Hematological: Negative for adenopathy. Does not bruise/bleed easily.  Psychiatric/Behavioral: Negative for disturbed wake/sleep cycle and decreased concentration.   Past Medical History  Diagnosis Date  . Seizures   . Abnormal heart rhythms   . Osteoarthritis     History   Social History  . Marital Status: Married    Spouse Name: N/A    Number of Children: 2  . Years of Education: N/A   Occupational History  . quit job-illness;was property mgmt    Social History Main Topics  . Smoking status: Former Smoker -- 2.5 packs/day for 35 years    Types: Cigarettes    Quit date: 03/06/2008  . Smokeless tobacco: Never Used  . Alcohol Use: No  . Drug Use: No  . Sexually Active: Not on file   Other  Topics Concern  . Not on file   Social History Narrative  . No narrative on file    Past Surgical History  Procedure Date  . Nasal sinus surgery   . Abdominal hysterectomy   . Oophorectomy   . Cyst removed     right wrist    Family History  Problem Relation Age of Onset  . Diabetes Father   . Heart disease Father   . Cancer Sister     unknow type-kind was shutting organs down  . Sleep apnea Sister   . Asthma Brother     bronchitis  . Rheum arthritis Mother     Allergies  Allergen Reactions  . Indomethacin   . Sulfonamide Derivatives     Current Outpatient Prescriptions on File Prior to Visit  Medication Sig Dispense Refill  . atenolol (TENORMIN) 25 MG tablet Take 1 tablet by mouth daily.      Marland Kitchen PHENobarbital (LUMINAL) 64.8 MG tablet Take 2 tablets (129.6 mg total) by mouth daily.  60 tablet  5  . lisdexamfetamine (VYVANSE) 50 MG capsule Take 1 capsule (50 mg total) by mouth every morning.  30 capsule  0  . sertraline (ZOLOFT) 25 MG tablet Take 1 tablet (25 mg total) by mouth daily.  30 tablet  6    BP 130/80  Pulse 72  Temp 98.6 F (37 C)  Resp 16  Ht 5\' 5"  (1.651 m)  Wt 148 lb (67.132 kg)  BMI 24.63 kg/m2        Objective:   Physical Exam  Nursing note  and vitals reviewed. Constitutional: She is oriented to person, place, and time. She appears well-developed and well-nourished. No distress.  HENT:  Head: Normocephalic and atraumatic.  Right Ear: External ear normal.  Left Ear: External ear normal.  Nose: Nose normal.  Mouth/Throat: Oropharynx is clear and moist.  Eyes: Conjunctivae normal and EOM are normal. Pupils are equal, round, and reactive to light.  Neck: Normal range of motion. Neck supple. No JVD present. No tracheal deviation present. No thyromegaly present.  Cardiovascular: Normal rate, regular rhythm, normal heart sounds and intact distal pulses.   No murmur heard. Pulmonary/Chest: Effort normal and breath sounds normal. She has no  wheezes. She exhibits no tenderness.  Abdominal: Soft. Bowel sounds are normal.  Musculoskeletal: Normal range of motion. She exhibits no edema and no tenderness.  Lymphadenopathy:    She has no cervical adenopathy.  Neurological: She is alert and oriented to person, place, and time. She has normal reflexes. No cranial nerve deficit.  Skin: Skin is warm and dry. She is not diaphoretic.  Psychiatric: She has a normal mood and affect. Her behavior is normal.          Assessment & Plan:  Agree with the change in her seizure medications she has had no seizures since then.  The patient is stable on vyvance and Zoloft Recommend continuing this protocol night a prescription was given Patient take low dose atenolol for palpitations she states her palpitations are controlled

## 2011-12-01 ENCOUNTER — Ambulatory Visit: Payer: Managed Care, Other (non HMO) | Admitting: Internal Medicine

## 2012-01-10 ENCOUNTER — Ambulatory Visit: Payer: Managed Care, Other (non HMO) | Admitting: Internal Medicine

## 2012-02-09 IMAGING — CT CT ABD-PELV W/ CM
2 of 5 series · 17 of 46 positions shown, 19 images · IV contrast (omnipaque)
Comparison: None.

CLINICAL DATA: Evaluate for diverticulitis versus appendicitis.

CT ABDOMEN AND PELVIS WITH CONTRAST
TECHNIQUE: Multidetector CT imaging of the abdomen and pelvis was
performed using the standard protocol following bolus
administration of intravenous contrast.
Contrast:  100 ml of Omnipaque

[Series 2: rtn a/p with · axial · 0.74mm/px · z∈[-444,-84]mm · 14 of 82 slices shown, 16 images]
[im 5/82  soft-tissue]
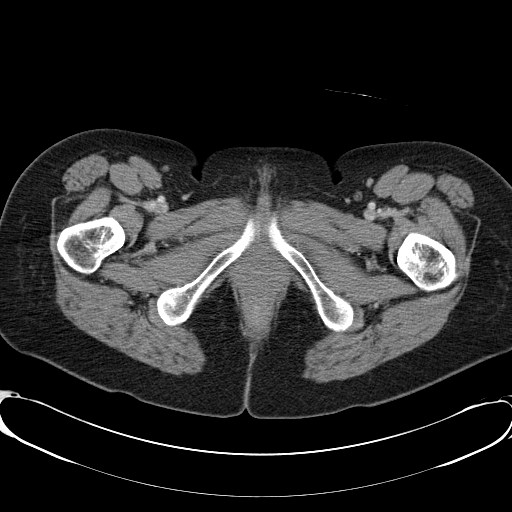
[im 5/82  bone]
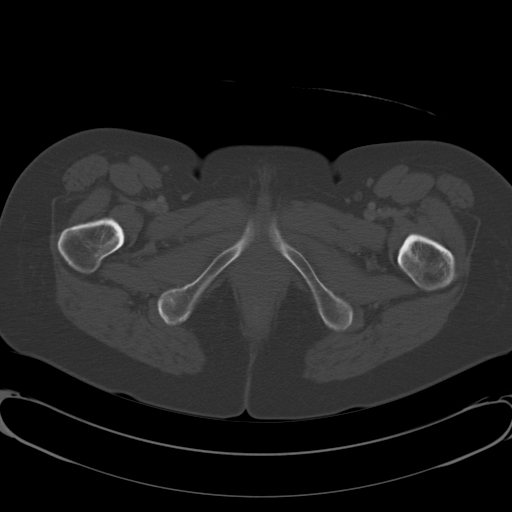
[im 9/82  soft-tissue]
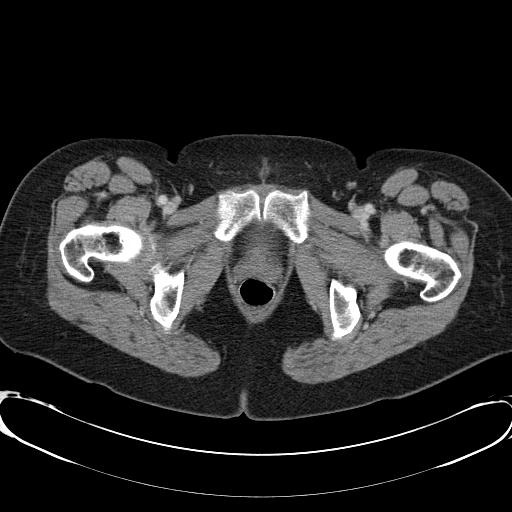
[im 18/82  soft-tissue]
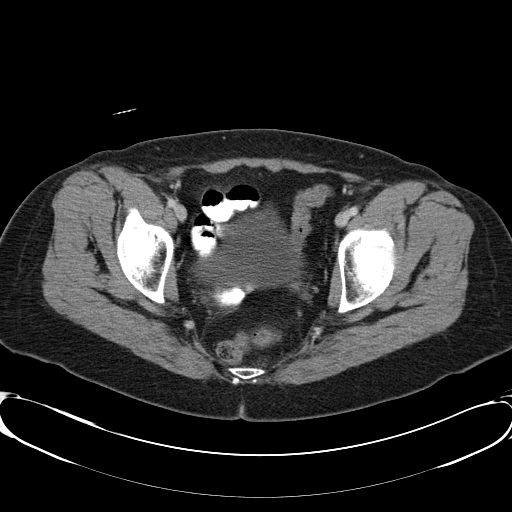
[im 22/82  soft-tissue]
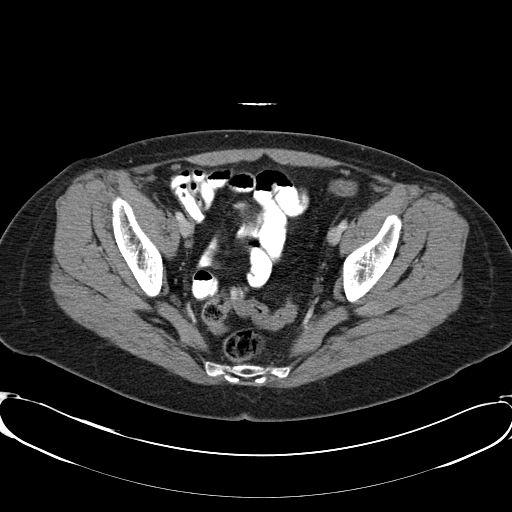
[im 26/82  soft-tissue]
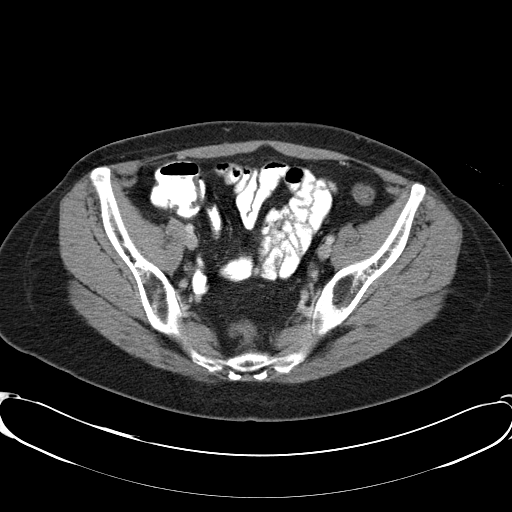
[im 35/82  soft-tissue]
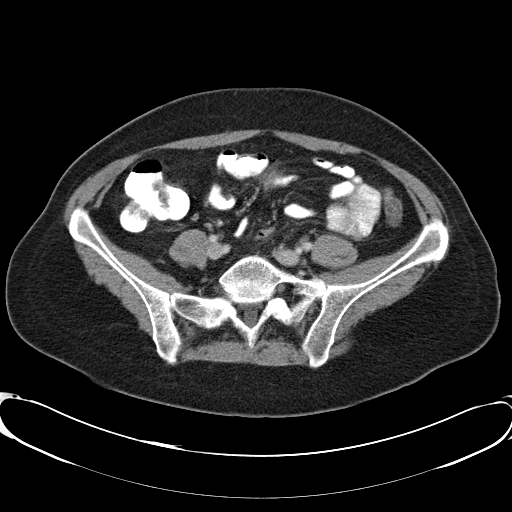
[im 39/82  soft-tissue]
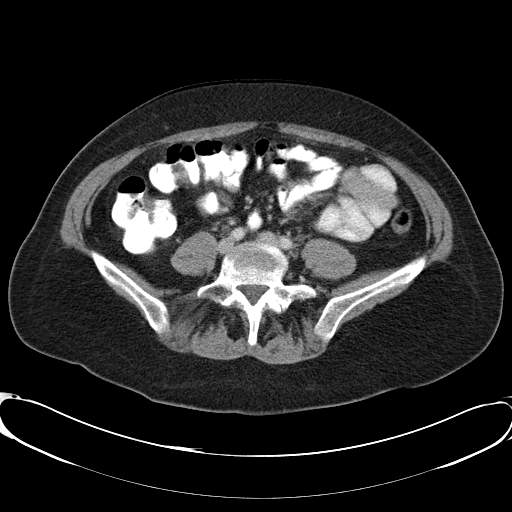
[im 43/82  soft-tissue]
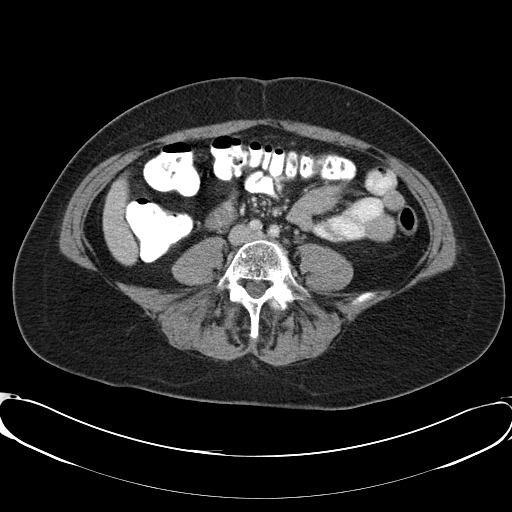
[im 47/82  soft-tissue]
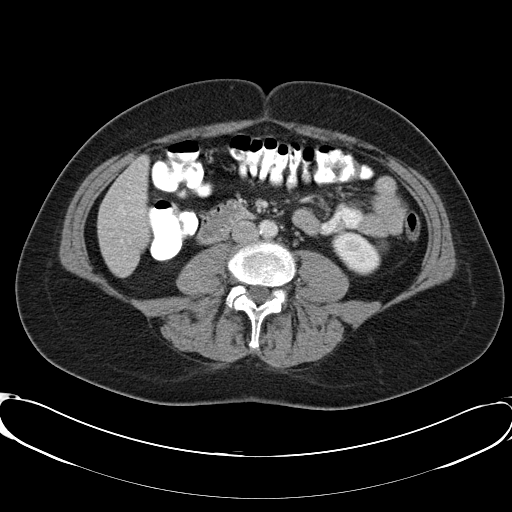
[im 47/82  bone]
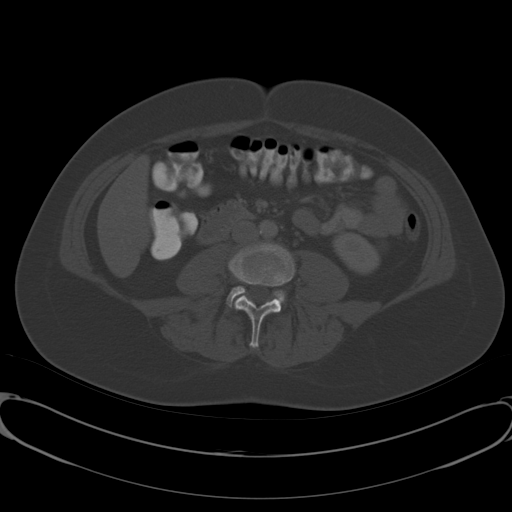
[im 56/82  soft-tissue]
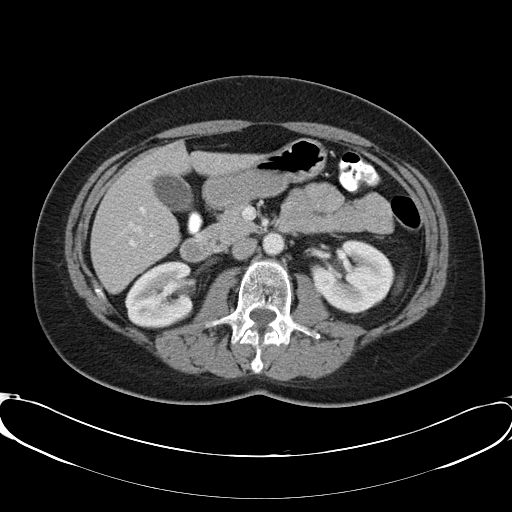
[im 60/82  soft-tissue]
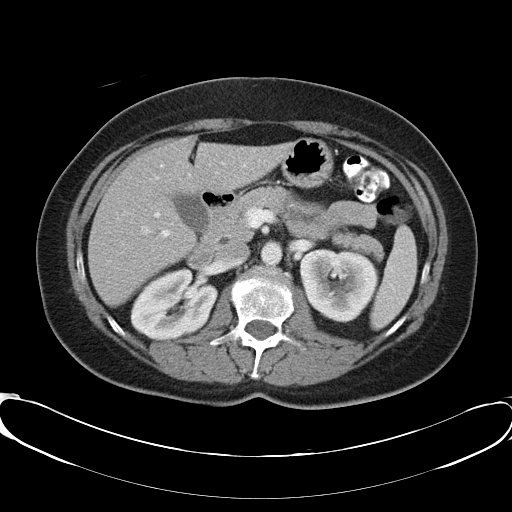
[im 64/82  soft-tissue]
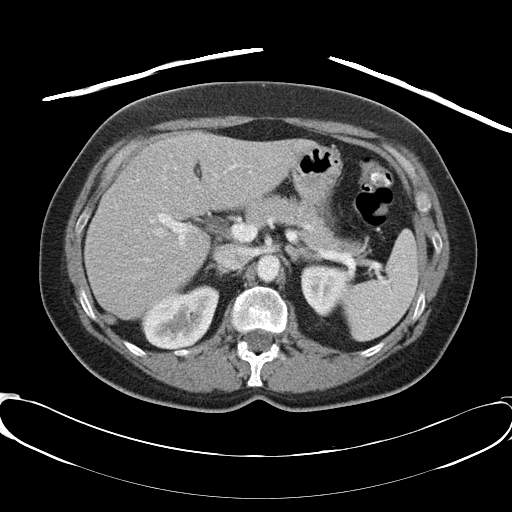
[im 73/82  soft-tissue]
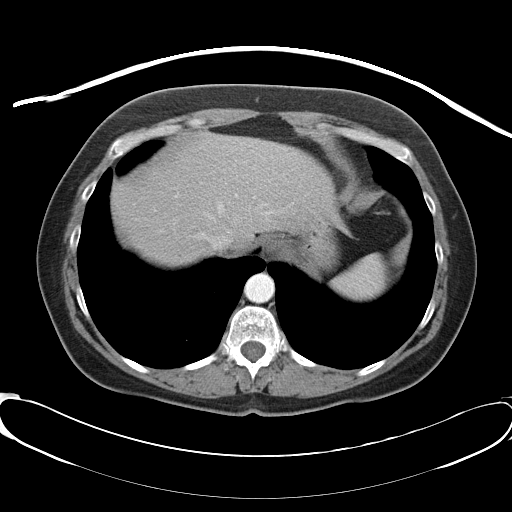
[im 77/82  soft-tissue]
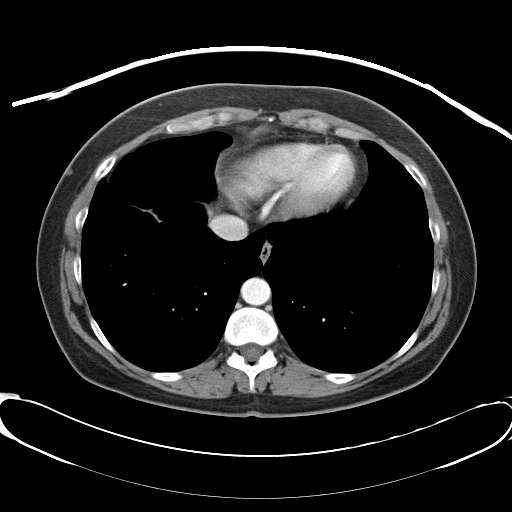

[Series 602: <mpr thick range> · coronal · 0.79mm/px · 3 of 80 slices shown]
[im 27/80  soft-tissue]
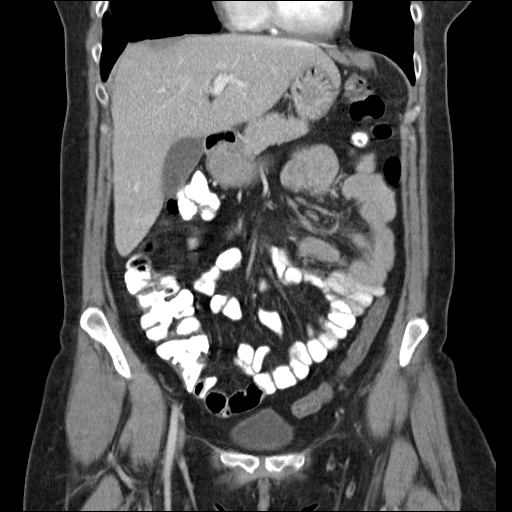
[im 36/80  soft-tissue]
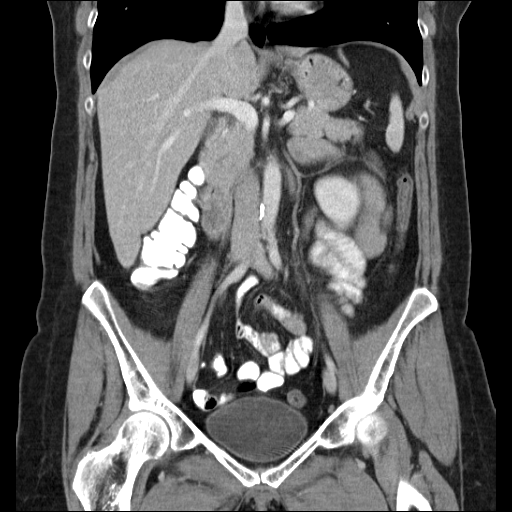
[im 44/80  soft-tissue]
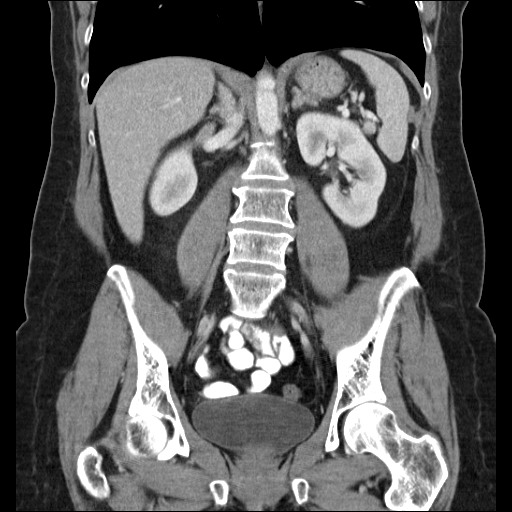

[17 of 46 positions shown; findings below may reference images not displayed]

FINDINGS: A right Bochdalek hernia is seen containing fat.  The
lung bases are clear.  The heart is normal in size.  The liver,
adrenal glands, gallbladder, spleen and pancreas are within normal
limits.  A 1.7 cm simple cyst is seen in the interpolar region of
the left kidney.  There are two sub centimeter hypoattenuating
lesions in the medial upper pole of the left kidney which are too
small to characterize.  No enhancing renal lesions are seen.  There
is no hydronephrosis.  The stomach is collapsed.  No small bowel
obstruction is identified.  Normal appendix is seen in the right
lower quadrant.  There is no evidence of diverticulosis.  No
inflammatory changes are seen in the colon.  The patient has had a
hysterectomy.  No adnexal masses seen.  Scattered atherosclerotic
calcifications are seen in the aorta.  There is no lymphadenopathy
in the abdomen or pelvis.  There is no free fluid.  The urinary
bladder is within normal limits.  No aggressive lytic or blastic
bone lesions are seen.
IMPRESSION: 1.  No acute findings in the abdomen or pelvis.  Specifically, no
evidence of diverticulitis or appendicitis.
2.  Simple renal cyst in the left kidney.  Other hypoattenuating
lesions in the upper pole of the left kidney are too small to
characterize.
3.  Incidental findings as above.

## 2012-02-16 ENCOUNTER — Telehealth: Payer: Self-pay

## 2012-02-16 MED ORDER — PHENOBARBITAL 64.8 MG PO TABS: 129.6000 mg | ORAL_TABLET | Freq: Every day | ORAL | Status: DC

## 2012-02-16 NOTE — Telephone Encounter (Signed)
Pt needs refill on phenobarbital 64.8 mg takes 2 tablet at night #60l  With refills [facility] [address]. This med was prescribed by Dr Modesto Charon. Pt has appt with Dr Modesto Charon at [facility] on 04-24-2012. Pt has only 2 tablets left for tonight

## 2012-02-16 NOTE — Telephone Encounter (Signed)
Done with 3 refills

## 2012-04-01 ENCOUNTER — Ambulatory Visit: Payer: Managed Care, Other (non HMO) | Admitting: Internal Medicine

## 2012-08-14 ENCOUNTER — Telehealth: Payer: Self-pay

## 2012-08-14 NOTE — Telephone Encounter (Signed)
L/m on both house and cell / ga

## 2012-08-14 NOTE — Telephone Encounter (Signed)
She can follow up with padonda or dr Kirtland Bouchard

## 2012-08-14 NOTE — Telephone Encounter (Signed)
PT called and stated that she was going to see Dr. Modesto Charon today to receive a RX for ADHD. She stated that she would need to follow up with Dr. Lovell Sheehan within 30 days of her appt today, is there anyway we can fit her in? I gave her the next available which was 11/29/12. Please assist.

## 2012-09-05 ENCOUNTER — Telehealth: Payer: Self-pay | Admitting: *Deleted

## 2012-09-05 MED ORDER — ATENOLOL 25 MG PO TABS: 25.0000 mg | ORAL_TABLET | Freq: Every day | ORAL | Status: DC

## 2012-09-05 NOTE — Telephone Encounter (Signed)
Refill sent to pharmacy electronically. °

## 2012-09-12 ENCOUNTER — Ambulatory Visit (INDEPENDENT_AMBULATORY_CARE_PROVIDER_SITE_OTHER): Payer: BC Managed Care – PPO | Admitting: Internal Medicine

## 2012-09-12 ENCOUNTER — Encounter: Payer: Self-pay | Admitting: Internal Medicine

## 2012-09-12 VITALS — BP 120/70 | HR 66 | Temp 98.3°F | Resp 20 | Wt 150.0 lb

## 2012-09-12 DIAGNOSIS — F909 Attention-deficit hyperactivity disorder, unspecified type: Secondary | ICD-10-CM

## 2012-09-12 DIAGNOSIS — Z8669 Personal history of other diseases of the nervous system and sense organs: Secondary | ICD-10-CM

## 2012-09-12 MED ORDER — AMPHETAMINE-DEXTROAMPHETAMINE 10 MG PO TABS: 10.0000 mg | ORAL_TABLET | Freq: Two times a day (BID) | ORAL | Status: DC

## 2012-09-12 NOTE — Progress Notes (Signed)
Subjective:    Patient ID: [name], female    DOB: [birth year], 57 y.o.   MRN: [id number]  HPI   57 year old patient who is seen today for followup. She has not been seen here in one year. She has a history of adult ADHD and was initially placed on Vyvanse.  This she tolerated poorly due to nausea and her with some saline neurologist, Dr. Modesto Charon, place her on Adderall 5 mg twice a day. She has found this quite helpful and well tolerated but she feels it is not quite strong enough. She has a history of seizure disorder and she states that neurology has obtained a recent laboratory studies. Her phenobarbital dose has been slightly up titrated.  Past Medical History  Diagnosis Date  . Seizures   . Abnormal heart rhythms   . Osteoarthritis     History   Social History  . Marital Status: Married    Spouse Name: N/A    Number of Children: 2  . Years of Education: N/A   Occupational History  . quit job-illness;was property mgmt    Social History Main Topics  . Smoking status: Former Smoker -- 2.50 packs/day for 35 years    Types: Cigarettes    Quit date: 03/06/2008  . Smokeless tobacco: Never Used  . Alcohol Use: No  . Drug Use: No  . Sexually Active: Not on file   Other Topics Concern  . Not on file   Social History Narrative  . No narrative on file    Past Surgical History  Procedure Laterality Date  . Nasal sinus surgery    . Abdominal hysterectomy    . Oophorectomy    . Cyst removed      right wrist    Family History  Problem Relation Age of Onset  . Diabetes Father   . Heart disease Father   . Cancer Sister     unknow type-kind was shutting organs down  . Sleep apnea Sister   . Asthma Brother     bronchitis  . Rheum arthritis Mother     Allergies  Allergen Reactions  . Indomethacin   . Sulfonamide Derivatives     Current Outpatient Prescriptions on File Prior to Visit  Medication Sig Dispense Refill  . atenolol (TENORMIN) 25 MG tablet Take 1  tablet (25 mg total) by mouth daily.  30 tablet  0   No current facility-administered medications on file prior to visit.    BP 120/70  Pulse 66  Temp(Src) 98.3 F (36.8 C) (Oral)  Resp 20  Wt 150 lb (68.04 kg)  BMI 24.96 kg/m2  SpO2 99%       Review of Systems  Constitutional: Negative.   HENT: Negative for hearing loss, congestion, sore throat, rhinorrhea, dental problem, sinus pressure and tinnitus.   Eyes: Negative for pain, discharge and visual disturbance.  Respiratory: Negative for cough and shortness of breath.   Cardiovascular: Negative for chest pain, palpitations and leg swelling.  Gastrointestinal: Negative for nausea, vomiting, abdominal pain, diarrhea, constipation, blood in stool and abdominal distention.  Genitourinary: Negative for dysuria, urgency, frequency, hematuria, flank pain, vaginal bleeding, vaginal discharge, difficulty urinating, vaginal pain and pelvic pain.  Musculoskeletal: Negative for joint swelling, arthralgias and gait problem.  Skin: Negative for rash.  Neurological: Positive for headaches. Negative for dizziness, syncope, speech difficulty, weakness and numbness.  Hematological: Negative for adenopathy.  Psychiatric/Behavioral: Positive for decreased concentration. Negative for behavioral problems, dysphoric mood and agitation. The patient is  not nervous/anxious.        Objective:   Physical Exam  Constitutional: She is oriented to person, place, and time. She appears well-developed and well-nourished.  HENT:  Head: Normocephalic.  Right Ear: External ear normal.  Left Ear: External ear normal.  Mouth/Throat: Oropharynx is clear and moist.  Eyes: Conjunctivae and EOM are normal. Pupils are equal, round, and reactive to light.  Neck: Normal range of motion. Neck supple. No thyromegaly present.  Cardiovascular: Normal rate, regular rhythm, normal heart sounds and intact distal pulses.   Pulmonary/Chest: Effort normal and breath sounds  normal.  Abdominal: Soft. Bowel sounds are normal. She exhibits no mass. There is no tenderness.  Musculoskeletal: Normal range of motion.  Lymphadenopathy:    She has no cervical adenopathy.  Neurological: She is alert and oriented to person, place, and time.  Skin: Skin is warm and dry. No rash noted.  Psychiatric: She has a normal mood and affect. Her behavior is normal.          Assessment & Plan:   ADHD. We'll give a trial of Adderall 10 mg twice a day. Seizure disorder stable  We'll asked the patient to contact neurology to fax results of her recent lab Recheck 6 months or as needed

## 2012-09-12 NOTE — Patient Instructions (Signed)
It is important that you exercise regularly, at least 20 minutes 3 to 4 times per week.  If you develop chest pain or shortness of breath seek  medical attention. Neurology followup  Please have neurology fax copies of your recent lab

## 2012-11-06 ENCOUNTER — Other Ambulatory Visit: Payer: Self-pay | Admitting: Cardiovascular Disease

## 2012-11-07 NOTE — Telephone Encounter (Signed)
Rx was sent to pharmacy electronically. 

## 2012-11-29 ENCOUNTER — Ambulatory Visit: Payer: Managed Care, Other (non HMO) | Admitting: Internal Medicine

## 2012-12-02 ENCOUNTER — Ambulatory Visit (INDEPENDENT_AMBULATORY_CARE_PROVIDER_SITE_OTHER): Payer: BC Managed Care – PPO | Admitting: Internal Medicine

## 2012-12-02 ENCOUNTER — Encounter: Payer: Self-pay | Admitting: Internal Medicine

## 2012-12-02 VITALS — BP 130/80 | HR 72 | Temp 98.6°F | Resp 16 | Ht 65.0 in | Wt 146.0 lb

## 2012-12-02 DIAGNOSIS — F909 Attention-deficit hyperactivity disorder, unspecified type: Secondary | ICD-10-CM

## 2012-12-02 DIAGNOSIS — K589 Irritable bowel syndrome without diarrhea: Secondary | ICD-10-CM

## 2012-12-02 DIAGNOSIS — M818 Other osteoporosis without current pathological fracture: Secondary | ICD-10-CM

## 2012-12-02 DIAGNOSIS — T387X5A Adverse effect of androgens and anabolic congeners, initial encounter: Secondary | ICD-10-CM

## 2012-12-02 DIAGNOSIS — K219 Gastro-esophageal reflux disease without esophagitis: Secondary | ICD-10-CM

## 2012-12-02 MED ORDER — AMPHETAMINE-DEXTROAMPHETAMINE 10 MG PO TABS: 10.0000 mg | ORAL_TABLET | Freq: Two times a day (BID) | ORAL | Status: DC

## 2012-12-02 NOTE — Progress Notes (Signed)
  Subjective:    Patient ID: [name], female    DOB: [birth year], 57 y.o.   MRN: [id number]  HPI Patient had blood work done and is visible in care everywhere she had phenobarbital levels as well as a basic metabolic panel liver panel and a CBC   Review of Systems  Constitutional: Negative for activity change, appetite change and fatigue.  HENT: Negative for ear pain, congestion, neck pain, postnasal drip and sinus pressure.   Eyes: Negative for redness and visual disturbance.  Respiratory: Negative for cough, shortness of breath and wheezing.   Gastrointestinal: Negative for abdominal pain and abdominal distention.  Genitourinary: Negative for dysuria, frequency and menstrual problem.  Musculoskeletal: Negative for myalgias, joint swelling and arthralgias.  Skin: Negative for rash and wound.  Neurological: Negative for dizziness, weakness and headaches.  Hematological: Negative for adenopathy. Does not bruise/bleed easily.  Psychiatric/Behavioral: Negative for sleep disturbance and decreased concentration.       Objective:   Physical Exam  Nursing note and vitals reviewed. Constitutional: She is oriented to person, place, and time. She appears well-developed and well-nourished. No distress.  HENT:  Head: Normocephalic and atraumatic.  Right Ear: External ear normal.  Left Ear: External ear normal.  Nose: Nose normal.  Mouth/Throat: Oropharynx is clear and moist.  Eyes: Conjunctivae and EOM are normal. Pupils are equal, round, and reactive to light.  Neck: Normal range of motion. Neck supple. No JVD present. No tracheal deviation present. No thyromegaly present.  Cardiovascular: Normal rate, regular rhythm, normal heart sounds and intact distal pulses.   No murmur heard. Pulmonary/Chest: Effort normal and breath sounds normal. She has no wheezes. She exhibits no tenderness.  Abdominal: Soft. Bowel sounds are normal.  Musculoskeletal: Normal range of motion. She exhibits no  edema and no tenderness.  Lymphadenopathy:    She has no cervical adenopathy.  Neurological: She is alert and oriented to person, place, and time. She has normal reflexes. No cranial nerve deficit.  Skin: Skin is warm and dry. She is not diaphoretic.  Psychiatric: She has a normal mood and affect. Her behavior is normal.          Assessment & Plan:  ADD refill per protocol intermittent vertigo? Hypoglycemia? Discussed diet Bone density  osteporosis prolia

## 2012-12-02 NOTE — Patient Instructions (Addendum)
prolia research and if covered we will call for shot

## 2012-12-03 ENCOUNTER — Other Ambulatory Visit: Payer: Self-pay | Admitting: *Deleted

## 2012-12-10 ENCOUNTER — Telehealth: Payer: Self-pay

## 2012-12-10 NOTE — Telephone Encounter (Signed)
Patient's Prolia injection has been approved by [facility] for 12/09/12 - 12/09/13. I called and LMTC on Lonisha's cell phone, to schedule injection . Per Rushie Goltz, Wed 10/8 is ok to do.

## 2012-12-25 ENCOUNTER — Telehealth: Payer: Self-pay

## 2012-12-25 NOTE — Telephone Encounter (Signed)
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 Pt states she has never had nor heard of or needs "androgen therapy". Pt states this was on her paperwork from her visit 12/02/12.

## 2012-12-27 ENCOUNTER — Other Ambulatory Visit: Payer: Self-pay | Admitting: *Deleted

## 2012-12-27 ENCOUNTER — Other Ambulatory Visit: Payer: Self-pay | Admitting: Internal Medicine

## 2012-12-27 NOTE — Telephone Encounter (Signed)
Dr Chancy Hurter talked with pt

## 2012-12-27 NOTE — Telephone Encounter (Signed)
It is not on her problem list but was used to order labs only . 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

## 2012-12-30 ENCOUNTER — Other Ambulatory Visit: Payer: Self-pay | Admitting: *Deleted

## 2012-12-30 ENCOUNTER — Telehealth: Payer: Self-pay | Admitting: *Deleted

## 2012-12-30 NOTE — Telephone Encounter (Signed)
Dr Lovell Sheehan wanted patient to have prolia injections, and we have it pre authorized. She was call insurance and find out her cost.  She calls today and states she is going to get a second opinion

## 2013-07-25 ENCOUNTER — Ambulatory Visit (INDEPENDENT_AMBULATORY_CARE_PROVIDER_SITE_OTHER): Payer: BC Managed Care – PPO | Admitting: Family Medicine

## 2013-07-25 ENCOUNTER — Encounter: Payer: Self-pay | Admitting: Family Medicine

## 2013-07-25 VITALS — BP 120/80 | HR 85 | Temp 98.5°F | Ht 65.0 in | Wt 145.5 lb

## 2013-07-25 DIAGNOSIS — M810 Age-related osteoporosis without current pathological fracture: Secondary | ICD-10-CM

## 2013-07-25 DIAGNOSIS — R519 Headache, unspecified: Secondary | ICD-10-CM

## 2013-07-25 DIAGNOSIS — R51 Headache: Secondary | ICD-10-CM

## 2013-07-25 DIAGNOSIS — T148XXA Other injury of unspecified body region, initial encounter: Secondary | ICD-10-CM

## 2013-07-25 DIAGNOSIS — Z8669 Personal history of other diseases of the nervous system and sense organs: Secondary | ICD-10-CM

## 2013-07-25 NOTE — Progress Notes (Signed)
Pre visit review using our clinic review tool, if applicable. No additional management support is needed unless otherwise documented below in the visit note. 

## 2013-07-25 NOTE — Progress Notes (Signed)
No chief complaint on file.   HPI:  58 yo F pt of Dr. Arnoldo Morale is here for an acute visit for:  1) Skin issues: -bruise on arm a week or so ago and now another on L arm 2 days ago -sees Dr. Jacelyn Grip for her seizures and just had lab work  2)Wants to see rheum: -reports Dr. Jacelyn Grip in neurology advised her to see rheum because - has osteoporosis related to her meds for seizures -also had? +lupus labs in past but told not lupus - she wants to recheck -she thinks she has lupus because  +ANA, has persistent chronic headaches sometimes - sees neurologist for this, cog issues - sees neurologist and thinks related to seizures as had bad seizures as a child, alertness issue -headaches are L neck and radiating to forehead - for many years sometimes accompanied by change in vision  ROS: See pertinent positives and negatives per HPI.  Past Medical History  Diagnosis Date  . Seizures   . Abnormal heart rhythms   . Osteoarthritis     Past Surgical History  Procedure Laterality Date  . Nasal sinus surgery    . Abdominal hysterectomy    . Oophorectomy    . Cyst removed      right wrist    Family History  Problem Relation Age of Onset  . Diabetes Father   . Heart disease Father   . Cancer Sister     unknow type-kind was shutting organs down  . Sleep apnea Sister   . Asthma Brother     bronchitis  . Rheum arthritis Mother     History   Social History  . Marital Status: Married    Spouse Name: N/A    Number of Children: 2  . Years of Education: N/A   Occupational History  . quit job-illness;was property mgmt    Social History Main Topics  . Smoking status: Former Smoker -- 2.50 packs/day for 35 years    Types: Cigarettes    Quit date: 03/06/2008  . Smokeless tobacco: Never Used  . Alcohol Use: No  . Drug Use: No  . Sexual Activity: None   Other Topics Concern  . None   Social History Narrative  . None    Current outpatient prescriptions:Calcium Carbonate Antacid 600 MG  chewable tablet, Chew 600 mg by mouth daily., Disp: , Rfl: ;  cholecalciferol (D-VI-SOL) 400 UNIT/ML LIQD, Take 400 Units by mouth daily., Disp: , Rfl: ;  PHENobarbital (LUMINAL) 64.8 MG tablet, Take 162 mg by mouth daily. 2.5 tablets daily, Disp: , Rfl:   EXAM:  Filed Vitals:   07/25/13 0834  BP: 120/80  Pulse: 85  Temp: 98.5 F (36.9 C)    Body mass index is 24.21 kg/(m^2).  GENERAL: vitals reviewed and listed above, alert, oriented, appears well hydrated and in no acute distress  HEENT: atraumatic, conjunttiva clear, no obvious abnormalities on inspection of external nose and ears  NECK: no obvious masses on inspection  LUNGS: clear to auscultation bilaterally, no wheezes, rales or rhonchi, good air movement  CV: HRRR, no peripheral edema  MS: moves all extremities without noticeable abnormality  PSYCH: pleasant and cooperative, no obvious depression or anxiety  ASSESSMENT AND PLAN:  Discussed the following assessment and plan:  SEIZURES, HX OF - Plan: Ambulatory referral to Rheumatology  Bruising - Plan: Ambulatory referral to Rheumatology  Frequent headaches - Plan: Ambulatory referral to Rheumatology  Osteoporosis - Plan: Ambulatory referral to Rheumatology  -she wants referral  to rheum for all of these issues and prefers to do labs with rheum -i suspect a form of atypical migraine versus ociptial neuralgia as etiology for her chronic headaches and advised she discuss tx options with her neurologist -Patient advised to return or notify a doctor immediately if symptoms worsen or persist or new concerns arise.  There are no Patient Instructions on file for this visit.   [name]

## 2013-07-27 ENCOUNTER — Encounter: Payer: Self-pay | Admitting: *Deleted

## 2013-07-29 ENCOUNTER — Ambulatory Visit: Payer: BC Managed Care – PPO | Admitting: Cardiovascular Disease

## 2013-07-29 ENCOUNTER — Ambulatory Visit: Payer: BC Managed Care – PPO | Admitting: Cardiology

## 2013-08-01 ENCOUNTER — Encounter: Payer: Self-pay | Admitting: Cardiovascular Disease

## 2013-09-18 ENCOUNTER — Encounter: Payer: Self-pay | Admitting: Cardiovascular Disease

## 2013-09-18 ENCOUNTER — Ambulatory Visit (INDEPENDENT_AMBULATORY_CARE_PROVIDER_SITE_OTHER): Payer: BC Managed Care – PPO | Admitting: Cardiovascular Disease

## 2013-09-18 ENCOUNTER — Encounter: Payer: Self-pay | Admitting: *Deleted

## 2013-09-18 VITALS — BP 106/80 | HR 109 | Resp 16 | Ht 65.0 in | Wt 143.1 lb

## 2013-09-18 DIAGNOSIS — R0602 Shortness of breath: Secondary | ICD-10-CM

## 2013-09-18 DIAGNOSIS — R000 Tachycardia, unspecified: Secondary | ICD-10-CM

## 2013-09-18 DIAGNOSIS — I10 Essential (primary) hypertension: Secondary | ICD-10-CM

## 2013-09-18 LAB — CBC
HCT: 39.7 % (ref 36.0–46.0)
Hemoglobin: 13.8 g/dL (ref 12.0–15.0)
MCH: 31.9 pg (ref 26.0–34.0)
MCHC: 34.8 g/dL (ref 30.0–36.0)
MCV: 91.9 fL (ref 78.0–100.0)
PLATELETS: 292 10*3/uL (ref 150–400)
RBC: 4.32 MIL/uL (ref 3.87–5.11)
RDW: 13.1 % (ref 11.5–15.5)
WBC: 6.1 10*3/uL (ref 4.0–10.5)

## 2013-09-18 MED ORDER — ATENOLOL 25 MG PO TABS: 25.0000 mg | ORAL_TABLET | Freq: Two times a day (BID) | ORAL | Status: DC

## 2013-09-18 NOTE — Assessment & Plan Note (Signed)
Restart low-dose beta blocker.

## 2013-09-18 NOTE — Patient Instructions (Signed)
Your physician recommends that you return for lab work   Your physician has requested that you have an echocardiogram. Echocardiography is a painless test that uses sound waves to create images of your heart. It provides your doctor with information about the size and shape of your heart and how well your heart's chambers and valves are working. This procedure takes approximately one hour. There are no restrictions for this procedure.  Your physician recommends that you schedule a follow-up appointment in: One year with Dr.Croitoru

## 2013-09-18 NOTE — Assessment & Plan Note (Signed)
Her current rhythm is clearly sinus tachycardia and I don't think her symptoms are arrhythmia related. The tachycardia is likely reactive/physiological. Since she continues to have complaints of shortness of breath it is not unreasonable to repeat her echocardiogram, to make sure there are no signs of congestive heart failure. If the study is normal I would not pursue further cardiac diagnostic testing. It is appropriate to check for hyperthyroidism. We'll also check a CBC for signs of possible occult infection, such as aspiration pneumonia following her grandmal seizure.

## 2013-09-18 NOTE — Progress Notes (Signed)
Patient ID: [name], female   DOB: 08-30-1955, 58 y.o.   MRN: [id number]     Reason for office visit Dyspnea, tachycardia  [name] has a history of asymptomatic preexcitation on her electrocardiogram, without ever having documented SVT. She presents today with complaints of fatigue, dyspnea and tachycardia. The symptoms began after she had a breakthrough grand mal seizure last Friday. Her seizures are usually well controlled on phenobarbital. She denies fever, chills, cough, orthopnea, PND, edema, tremor, unexplained weight loss, intolerance to heat or cold.  About 3-1/2 years ago she had a stress test and an echocardiogram, as part of evaluation for functional class II dyspnea on exertion, which has persisted unchanged. There was subtle thickening of the mitral valve but otherwise both tests were normal. She had a negative "bubble study".  She had been taking atenolol 25 mg once daily for hypertension and palpitations, but stopped this in February after missing an appointment.  She seen a rheumatologist for what sounds like degenerative arthritis. She does have a "false positive" test for lupus. Her rheumatologist has recently sent off blood work for anticardiolipin antibodies, apparently in response to patient's complaints of worsening memory.   Allergies  Allergen Reactions  . Indomethacin   . Sulfamethoxazole Swelling and Hives  . Sulfonamide Derivatives     Current Outpatient Prescriptions  Medication Sig Dispense Refill  . PHENobarbital (LUMINAL) 64.8 MG tablet Take 162 mg by mouth daily. 2.5 tablets daily      . Calcium Carbonate Antacid 600 MG chewable tablet Chew 600 mg by mouth daily.      . cholecalciferol (D-VI-SOL) 400 UNIT/ML LIQD Take 400 Units by mouth daily.       No current facility-administered medications for this visit.    Past Medical History  Diagnosis Date  . Seizures   . 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  Osteoarthritis     Past Surgical History  Procedure Laterality Date  . Nasal sinus surgery    . Abdominal hysterectomy  1998  . Oophorectomy  2001 or 2003  . Cyst removed      right wrist  . Transthoracic echocardiogram  09/2009    EF=>55%, possible mild anterior mitral leaflet thickening, trace MR; mild TR; trace pulm valve regurg  . Nm myocar perf wall motion  04/2010    bruce myoview - perfusion defects (r/t breast attenuation and/or diaphragmatic attenuation), no significant ischemia, EF 72%, ECG positive for ischemia (~80mm horizontal ST depression in V3-5 w/some in V2; low risk scan    Family History  Problem Relation Age of Onset  . Diabetes Father   . Heart disease Father     also ruptured gallbladder  . Cancer Sister     unknown type-kind was shutting organs down  . Sleep apnea Sister   . Asthma Brother     bronchitis  . Rheum arthritis Mother   . Heart Problems Mother     palpitations  . Skin cancer Mother   . Diabetes type II Brother     History   Social History  . Marital Status: Married    Spouse Name: N/A    Number of Children: 2  . Years of Education: associates   Occupational History  . quit job-illness;was property mgmt    Social History Main Topics  . Smoking status: Former Smoker -- 2.50 packs/day for 35 years    Types: Cigarettes    Quit date: 03/06/2008  .  Smokeless tobacco: Never Used  . Alcohol Use: No  . Drug Use: No  . Sexual Activity: Not on file   Other Topics Concern  . Not on file   Social History Narrative  . No narrative on file    Review of systems: The patient specifically denies any chest pain at rest or with exertion, orthopnea, paroxysmal nocturnal dyspnea, syncope, palpitations, focal neurological deficits, intermittent claudication, lower extremity edema, unexplained weight gain, cough, hemoptysis or wheezing.  The patient also denies abdominal pain, nausea, vomiting, dysphagia, diarrhea, constipation, polyuria,  polydipsia, dysuria, hematuria, frequency, urgency, abnormal bleeding or bruising, fever, chills, unexpected weight changes, mood swings, change in skin or hair texture, change in voice quality, auditory or visual problems, allergic reactions or rashes, new musculoskeletal complaints other than usual "aches and pains".   PHYSICAL EXAM BP 106/80  Pulse 109  Resp 16  Ht 5\' 5"  (1.651 m)  Wt 143 lb 2 oz (64.921 kg)  BMI 23.82 kg/m2  General: Alert, oriented x3, no distress Head: no evidence of trauma, PERRL, EOMI, no exophtalmos or lid lag, no myxedema, no xanthelasma; normal ears, nose and oropharynx Neck: normal jugular venous pulsations and no hepatojugular reflux; brisk carotid pulses without delay and no carotid bruits Chest: clear to auscultation, no signs of consolidation by percussion or palpation, normal fremitus, symmetrical and full respiratory excursions Cardiovascular: normal position and quality of the apical impulse, regular rhythm, normal first and second heart sounds, no murmurs, rubs or gallops Abdomen: no tenderness or distention, no masses by palpation, no abnormal pulsatility or arterial bruits, normal bowel sounds, no hepatosplenomegaly Extremities: no clubbing, cyanosis or edema; 2+ radial, ulnar and brachial pulses bilaterally; 2+ right femoral, posterior tibial and dorsalis pedis pulses; 2+ left femoral, posterior tibial and dorsalis pedis pulses; no subclavian or femoral bruits Neurological: grossly nonfocal   EKG: Sinus tachycardia, short PR interval and delta wave(positive in V1 V2) -type A, probably left posteroseptal pathway    ASSESSMENT AND PLAN Tachycardia Her current rhythm is clearly sinus tachycardia and I don't think her symptoms are arrhythmia related. The tachycardia is likely reactive/physiological. Since she continues to have complaints of shortness of breath it is not unreasonable to repeat her echocardiogram, to make sure there are no signs of  congestive heart failure. If the study is normal I would not pursue further cardiac diagnostic testing. It is appropriate to check for hyperthyroidism. We'll also check a CBC for signs of possible occult infection, such as aspiration pneumonia following her grandmal seizure.  HTN (hypertension) Restart low-dose beta blocker.   Orders Placed This Encounter  Procedures  . EKG 12-Lead   No orders of the defined types were placed in this encounter.    Holli Humbles, MD, [place] [phone] office [phone] pager

## 2013-09-19 LAB — TSH: TSH: 0.452 u[IU]/mL (ref 0.350–4.500)

## 2013-09-24 ENCOUNTER — Ambulatory Visit (HOSPITAL_COMMUNITY)
Admission: RE | Admit: 2013-09-24 | Discharge: 2013-09-24 | Disposition: A | Discharge status: Home / Self Care | Payer: BC Managed Care – PPO | Source: Ambulatory Visit | Attending: Internal Medicine | Admitting: Internal Medicine

## 2013-09-24 DIAGNOSIS — R0602 Shortness of breath: Secondary | ICD-10-CM | POA: Insufficient documentation

## 2013-09-24 NOTE — Progress Notes (Signed)
2D Echo Performed 09/24/2013    Marygrace Drought, RCS

## 2014-08-02 ENCOUNTER — Encounter (HOSPITAL_COMMUNITY): Payer: Self-pay | Admitting: Emergency Medicine

## 2014-08-02 ENCOUNTER — Emergency Department (HOSPITAL_COMMUNITY)
Admission: EM | Admit: 2014-08-02 | Discharge: 2014-08-02 | Disposition: A | Discharge status: Home / Self Care | Payer: BC Managed Care – PPO | Source: Home / Self Care | Attending: Emergency Medicine | Admitting: Emergency Medicine

## 2014-08-02 DIAGNOSIS — T1591XA Foreign body on external eye, part unspecified, right eye, initial encounter: Secondary | ICD-10-CM | POA: Diagnosis not present

## 2014-08-02 MED ORDER — ERYTHROMYCIN 5 MG/GM OP OINT: TOPICAL_OINTMENT | OPHTHALMIC | Status: DC

## 2014-08-02 MED ORDER — FLUORESCEIN SODIUM 1 MG OP STRP
ORAL_STRIP | OPHTHALMIC | Status: AC
  Filled 2014-08-02: qty 1

## 2014-08-02 NOTE — ED Provider Notes (Signed)
CSN: [id number]     Arrival date & time 08/02/14  0915 History   First MD Initiated Contact with Patient 08/02/14 0940     Chief Complaint  Patient presents with  . Eye Pain   (Consider location/radiation/quality/duration/timing/severity/associated sxs/prior Treatment) HPI  She is a 59 year old woman here for evaluation of right eye irritation. She states she woke up at 5 AM this morning with pain and irritation of the right eye. She states it feels like there is something under her upper eyelid that is scraping across her eye every time she blinks. She denies any change in her vision. She does report some swelling of the upper eyelid. She has tried washing the eye with saline and water without improvement. She denies any injury or trauma to the eye.  Past Medical History  Diagnosis Date  . Seizures   . 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 Osteoarthritis    Past Surgical History  Procedure Laterality Date  . Nasal sinus surgery    . Abdominal hysterectomy  1998  . Oophorectomy  2001 or 2003  . Cyst removed      right wrist  . Transthoracic echocardiogram  09/2009    EF=>55%, possible mild anterior mitral leaflet thickening, trace MR; mild TR; trace pulm valve regurg  . Nm myocar perf wall motion  04/2010    bruce myoview - perfusion defects (r/t breast attenuation and/or diaphragmatic attenuation), no significant ischemia, EF 72%, ECG positive for ischemia (~80mm horizontal ST depression in V3-5 w/some in V2; low risk scan   Family History  Problem Relation Age of Onset  . Diabetes Father   . Heart disease Father     also ruptured gallbladder  . Cancer Sister     unknown type-kind was shutting organs down  . Sleep apnea Sister   . Asthma Brother     bronchitis  . Rheum arthritis Mother   . Heart Problems Mother     palpitations  . Skin cancer Mother   . Diabetes type II Brother    History  Substance Use Topics  . Smoking status: Former Smoker --  2.50 packs/day for 35 years    Types: Cigarettes    Quit date: 03/06/2008  . Smokeless tobacco: Never Used  . Alcohol Use: No   OB History    No data available     Review of Systems As in history of present illness Allergies  Indomethacin; Sulfamethoxazole; and Sulfonamide derivatives  Home Medications   Prior to Admission medications   Medication Sig Start Date End Date Taking? Authorizing Provider  PHENobarbital (LUMINAL) 64.8 MG tablet Take 162 mg by mouth daily. 2.5 tablets daily 02/16/12  Yes Ricard Dillon, MD  atenolol (TENORMIN) 25 MG tablet Take 1 tablet (25 mg total) by mouth 2 (two) times daily. 09/18/13   Mihai Croitoru, MD  Calcium Carbonate Antacid 600 MG chewable tablet Chew 600 mg by mouth daily.    Historical Provider, MD  cholecalciferol (D-VI-SOL) 400 UNIT/ML LIQD Take 400 Units by mouth daily.    Historical Provider, MD  erythromycin ophthalmic ointment Place a 1/2 inch ribbon of ointment into the lower eyelid 3 times a day for 3 days. 08/02/14   Melony Overly, MD   BP 136/88 mmHg  Pulse 82  Temp(Src) 97.5 F (36.4 C) (Oral)  Resp 16  SpO2 96% Physical Exam  Constitutional: She is oriented to person, place, and time. She appears well-developed and  well-nourished. No distress.  Eyes: Pupils are equal, round, and reactive to light. Right eye exhibits no hordeolum. Foreign body present in the right eye. Left eye exhibits no hordeolum. Right conjunctiva is injected. Right conjunctiva has no hemorrhage. Left conjunctiva is not injected. Right eye exhibits normal extraocular motion.  Foreign body under right upper eyelid.  Cardiovascular: Normal rate.   Pulmonary/Chest: Effort normal.  Neurological: She is alert and oriented to person, place, and time.    ED Course  FOREIGN BODY REMOVAL Date/Time: 08/02/2014 10:23 AM Performed by: Melony Overly Authorized by: Melony Overly Consent: Verbal consent obtained. Risks and benefits: risks, benefits and alternatives  were discussed Consent given by: patient Patient understanding: patient states understanding of the procedure being performed Patient identity confirmed: verbally with patient Time out: Immediately prior to procedure a "time out" was called to verify the correct patient, procedure, equipment, support staff and site/side marked as required. Body area: eye Location details: right eyelid Local anesthetic: tetracaine drops Anesthetic total: 2 drops Localization method: eyelid eversion and visualized Removal mechanism: moist cotton swab Eye not examined with fluorescein. Complexity: simple 1 objects recovered. Objects recovered: dirt Post-procedure assessment: foreign body removed Patient tolerance: Patient tolerated the procedure well with no immediate complications   (including critical care time) Labs Review Labs Reviewed - No data to display  Imaging Review No results found.   MDM   1. Foreign body, eye, right, initial encounter    Recommended erythromycin ointment 3 times a day for the next 3 days. Ice to help with the swelling of the upper eyelid. Follow-up with ophthalmologist if continued problems.    Melony Overly, MD 08/02/14 1025

## 2014-08-02 NOTE — Discharge Instructions (Signed)
We removed a piece of dirt from your eye. Use the erythromycin eye ointment 3 times a day for the next 3 days. Apply ice to the eyelid to help with the swelling. Follow-up with your eye doctor as needed.

## 2014-08-02 NOTE — ED Notes (Signed)
Pt comes in with c/o sudden right eye irritation with pain  States she woke up out of sleep this am with irritation. No known injury noted Swelling,drainage with redness noted Flushed with saline solution without relief

## 2014-09-24 ENCOUNTER — Encounter: Payer: Self-pay | Admitting: Internal Medicine

## 2014-12-28 ENCOUNTER — Encounter: Payer: Self-pay | Admitting: Gastroenterology

## 2015-03-04 ENCOUNTER — Ambulatory Visit: Payer: BC Managed Care – PPO | Admitting: Gastroenterology

## 2015-03-04 ENCOUNTER — Ambulatory Visit (INDEPENDENT_AMBULATORY_CARE_PROVIDER_SITE_OTHER): Payer: BC Managed Care – PPO | Admitting: Gastroenterology

## 2015-03-04 ENCOUNTER — Encounter: Payer: Self-pay | Admitting: Gastroenterology

## 2015-03-04 VITALS — BP 112/78 | HR 80 | Ht 65.0 in | Wt 153.1 lb

## 2015-03-04 DIAGNOSIS — K5900 Constipation, unspecified: Secondary | ICD-10-CM | POA: Diagnosis not present

## 2015-03-04 DIAGNOSIS — K589 Irritable bowel syndrome without diarrhea: Secondary | ICD-10-CM

## 2015-03-04 DIAGNOSIS — D3A026 Benign carcinoid tumor of the rectum: Secondary | ICD-10-CM | POA: Diagnosis not present

## 2015-03-04 MED ORDER — NA SULFATE-K SULFATE-MG SULF 17.5-3.13-1.6 GM/177ML PO SOLN: 1.0000 | Freq: Once | ORAL | Status: DC

## 2015-03-04 MED ORDER — LINACLOTIDE 145 MCG PO CAPS: 145.0000 ug | ORAL_CAPSULE | Freq: Every day | ORAL | Status: DC

## 2015-03-04 NOTE — Patient Instructions (Signed)
You have been scheduled for a colonoscopy. Please follow written instructions given to you at your visit today.  Please pick up your prep supplies at the pharmacy within the next 1-3 days. If you use inhalers (even only as needed), please bring them with you on the day of your procedure. Your physician has requested that you go to www.startemmi.com and enter the access code given to you at your visit today. This web site gives a general overview about your procedure. However, you should still follow specific instructions given to you by our office regarding your preparation for the procedure.  We are sending in [name] to your pharmacy Follow up in 3 months

## 2015-03-04 NOTE — Progress Notes (Signed)
[name]    [id number]    [birth year]  Primary Care Physician:Jenkins, Latricia Heft  Referring Physician: No referring provider defined for this encounter.  Chief complaint:   Irritable bowel syndrome HPI:  59 year old female with irritable bowel syndrome predominant constipation here for follow-up visit.  She has a bowel movement about once in a week and has associated bloating and lower abdominal discomfort.  Once in a while she has diarrhea that can last up to 2-3 days.  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  Likely the rectal carcinoid was completely resected.  Denies any blood per rectum.    Outpatient Encounter Prescriptions as of 03/04/2015  Medication Sig  . ALPRAZolam (XANAX) 1 MG tablet Take 1 mg by mouth 2 (two) times daily.  . Cholecalciferol (VITAMIN D3) 3000 units TABS Take by mouth.  . meloxicam (MOBIC) 15 MG tablet Take 15 mg by mouth daily.  Marland Kitchen PHENobarbital (LUMINAL) 64.8 MG tablet Take 162 mg by mouth daily. 2.5 tablets daily  . [DISCONTINUED] atenolol (TENORMIN) 25 MG tablet Take 1 tablet (25 mg total) by mouth 2 (two) times daily.  . [DISCONTINUED] Calcium Carbonate Antacid 600 MG chewable tablet Chew 600 mg by mouth daily.  . [DISCONTINUED] cholecalciferol (D-VI-SOL) 400 UNIT/ML LIQD Take 400 Units by mouth daily.  . [DISCONTINUED] erythromycin ophthalmic ointment Place a 1/2 inch ribbon of ointment into the lower eyelid 3 times a day for 3 days.   No facility-administered encounter medications on file as of 03/04/2015.    Allergies as of 03/04/2015 - Review Complete 08/02/2014  Allergen Reaction Noted  . Indomethacin  08/24/2011  . Sulfamethoxazole Swelling and Hives 07/25/2013  . Sulfonamide derivatives  12/23/2008    Past Medical History  Diagnosis Date  . Seizures ([place])   . Abnormal heart rhythm     WPW  pattern w/delta waves oon EKG  . Osteoarthritis     Past Surgical History  Procedure Laterality Date  . Nasal sinus surgery    . Abdominal hysterectomy  1998  . Oophorectomy  2001 or 2003  . Cyst removed      right wrist  . Transthoracic echocardiogram  09/2009    EF=>55%, possible mild anterior mitral leaflet thickening, trace MR; mild TR; trace pulm valve regurg  . Nm myocar perf wall motion  04/2010    bruce myoview - perfusion defects (r/t breast attenuation and/or diaphragmatic attenuation), no significant ischemia, EF 72%, ECG positive for ischemia (~49mm horizontal ST depression in V3-5 w/some in V2; low risk scan    Family History  Problem Relation Age of Onset  . Diabetes Father   . Heart disease Father     also ruptured gallbladder  . Cancer Sister     unknown type-kind was shutting organs down  . Sleep apnea Sister   . Asthma Brother     bronchitis  . Rheum arthritis Mother   . Heart Problems Mother     palpitations  . Skin cancer Mother     melanoma  . Diabetes type II Brother     Social History   Social History  . Marital Status: Married    Spouse Name: N/A  . Number of Children: 2  . Years of Education: associates   Occupational History  . quit job-illness;was property mgmt  Social History Main Topics  . Smoking status: Former Smoker -- 2.50 packs/day for 35 years    Types: Cigarettes    Quit date: 03/06/2008  . Smokeless tobacco: Never Used  . Alcohol Use: No  . Drug Use: No  . Sexual Activity: Not on file   Other Topics Concern  . Not on file   Social History Narrative      Review of systems: Review of Systems  Constitutional: Negative for fever and chills.  HENT: Negative.   Eyes: Negative for blurred vision.  Respiratory: Negative for cough, shortness of breath and wheezing.   Cardiovascular: Negative for chest pain and palpitations.  Gastrointestinal: as per HPI Genitourinary: Negative for dysuria, urgency, frequency and  hematuria.  Musculoskeletal: Negative for myalgias, back pain and joint pain.  Skin: Negative for itching and rash.  Neurological: Negative for dizziness, tremors, focal weakness, seizures and loss of consciousness.  Endo/Heme/Allergies: Negative for environmental allergies.  Psychiatric/Behavioral: Negative for depression, suicidal ideas and hallucinations.  All other systems reviewed and are negative.   Physical Exam: Filed Vitals:   03/04/15 1508  BP: 112/78  Pulse: 80   Gen:      No acute distress HEENT:  EOMI, sclera anicteric Neck:     No masses; no thyromegaly Lungs:    Clear to auscultation bilaterally; normal respiratory effort CV:         Regular rate and rhythm; no murmurs Abd:      + bowel sounds; soft, non-tender; no palpable masses, no distension Ext:    No edema; adequate peripheral perfusion Skin:      Warm and dry; no rash Neuro: alert and oriented x 3 Psych: normal mood and affect  Data Reviewed:   colonoscopy 2010  Flexible sigmoidoscopy 2011  Assessment and Plan/Recommendations:   59 year old female with history of irritable bowel syndrome predominant constipation with occasional diarrhea here for follow-up visit  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 Denzil Magnuson , MD [phone] Mon-Fri 8a-5p [phone] after 5p, weekends, holidays

## 2015-03-15 ENCOUNTER — Telehealth: Payer: Self-pay | Admitting: Gastroenterology

## 2015-03-15 NOTE — Telephone Encounter (Signed)
Patient notified that she will still need to prep for her procedure.   She will complete her prep as planned.

## 2015-03-16 ENCOUNTER — Ambulatory Visit (AMBULATORY_SURGERY_CENTER): Payer: BC Managed Care – PPO | Admitting: Gastroenterology

## 2015-03-16 ENCOUNTER — Encounter: Payer: Self-pay | Admitting: Gastroenterology

## 2015-03-16 VITALS — BP 105/65 | HR 78 | Temp 98.5°F | Resp 23 | Ht 65.0 in | Wt 153.0 lb

## 2015-03-16 DIAGNOSIS — K5900 Constipation, unspecified: Secondary | ICD-10-CM | POA: Diagnosis not present

## 2015-03-16 DIAGNOSIS — D120 Benign neoplasm of cecum: Secondary | ICD-10-CM

## 2015-03-16 DIAGNOSIS — Z85040 Personal history of malignant carcinoid tumor of rectum: Secondary | ICD-10-CM

## 2015-03-16 DIAGNOSIS — K5909 Other constipation: Secondary | ICD-10-CM

## 2015-03-16 MED ORDER — SODIUM CHLORIDE 0.9 % IV SOLN: 500.0000 mL | INTRAVENOUS | Status: DC

## 2015-03-16 NOTE — Patient Instructions (Signed)
YOU HAD AN ENDOSCOPIC PROCEDURE TODAY AT [facility]:   Refer to the procedure report that was given to you for any specific questions about what was found during the examination.  If the procedure report does not answer your questions, please call your gastroenterologist to clarify.  If you requested that your care partner not be given the details of your procedure findings, then the procedure report has been included in a sealed envelope for you to review at your convenience later.  YOU SHOULD EXPECT: Some feelings of bloating in the abdomen. Passage of more gas than usual.  Walking can help get rid of the air that was put into your GI tract during the procedure and reduce the bloating. If you had a lower endoscopy (such as a colonoscopy or flexible sigmoidoscopy) you may notice spotting of blood in your stool or on the toilet paper. If you underwent a bowel prep for your procedure, you may not have a normal bowel movement for a few days.  Please Note:  You might notice some irritation and congestion in your nose or some drainage.  This is from the oxygen used during your procedure.  There is no need for concern and it should clear up in a day or so.  SYMPTOMS TO REPORT IMMEDIATELY:   Following lower endoscopy (colonoscopy or flexible sigmoidoscopy):  Excessive amounts of blood in the stool  Significant tenderness or worsening of abdominal pains  Swelling of the abdomen that is new, acute  Fever of 100F or higher   For urgent or emergent issues, a gastroenterologist can be reached at any hour by calling [phone].   DIET: Your first meal following the procedure should be a small meal and then it is ok to progress to your normal diet. Heavy or fried foods are harder to digest and may make you feel nauseous or bloated.  Likewise, meals heavy in dairy and vegetables can increase bloating.  Drink plenty of fluids but you should avoid alcoholic beverages for 24  hours.  ACTIVITY:  You should plan to take it easy for the rest of today and you should NOT DRIVE or use heavy machinery until tomorrow (because of the sedation medicines used during the test).    FOLLOW UP: Our staff will call the number listed on your records the next business day following your procedure to check on you and address any questions or concerns that you may have regarding the information given to you following your procedure. If we do not reach you, we will leave a message.  However, if you are feeling well and you are not experiencing any problems, there is no need to return our call.  We will assume that you have returned to your regular daily activities without incident.  If any biopsies were taken you will be contacted by phone or by letter within the next 1-3 weeks.  Please call us at [phone] if you have not heard about the biopsies in 3 weeks.    SIGNATURES/CONFIDENTIALITY: You and/or your care partner have signed paperwork which will be entered into your electronic medical record.  These signatures attest to the fact that that the information above on your After Visit Summary has been reviewed and is understood.  Full responsibility of the confidentiality of this discharge information lies with you and/or your care-partner.  Polyps and hemorrhoid information.

## 2015-03-16 NOTE — Progress Notes (Signed)
Patient awakening,vss,report to rn 

## 2015-03-16 NOTE — Progress Notes (Signed)
Called to room to assist during endoscopic procedure.  Patient ID and intended procedure confirmed with present staff. Received instructions for my participation in the procedure from the performing physician.  

## 2015-03-16 NOTE — Op Note (Signed)
[facility]  [address] [place], [address]   COLONOSCOPY PROCEDURE REPORT  PATIENT: [name], [name]  MR#: [id number] BIRTHDATE: 23-Apr-1955 , 8  yrs. old GENDER: female ENDOSCOPIST: Harl Bowie, MD REFERRED [id number] Vear Clock, M.D. PROCEDURE DATE:  03/16/2015 PROCEDURE:   Colonoscopy, surveillance and Colonoscopy with cold biopsy polypectomy First Screening Colonoscopy - Avg.  risk and is 50 yrs.  old or older - No.  Prior Negative Screening - Now for repeat screening. N/A  History of Adenoma - Now for follow-up colonoscopy & has been > or = to 3 yrs.  Yes hx of adenoma.  Has been 3 or more years since last colonoscopy.  Polyps removed today? Yes ASA CLASS:   Class II INDICATIONS:Surveillance due to prior colonic neoplasia. MEDICATIONS: Propofol 450 mg IV  DESCRIPTION OF PROCEDURE:   After the risks benefits and alternatives of the procedure were thoroughly explained, informed consent was obtained.  The digital rectal exam revealed no abnormalities of the rectum.   The LB [id number]  endoscope was introduced through the anus and advanced to the cecum, which was identified by both the appendix and ileocecal valve. No adverse events experienced.   The quality of the prep was good.  The instrument was then slowly withdrawn as the colon was fully examined. Estimated blood loss is zero unless otherwise noted in this procedure report.    COLON FINDINGS: A sessile polyp ranging between 3-3mm in size was found at the cecum.  A polypectomy was performed with cold forceps. The resection was complete, the polyp tissue was completely retrieved and sent to histology.   Small internal hemorrhoids were found.   The examination was otherwise normal.  Retroflexed views revealed internal hemorrhoids. The time to cecum = 6.3 Withdrawal time = 11.4   The scope was withdrawn and the procedure completed. COMPLICATIONS: There were no immediate  complications.  ENDOSCOPIC IMPRESSION: 1.   Sessile polyp ranging between 3-81mm in size was found at the cecum; polypectomy was performed with cold forceps 2.   Small internal hemorrhoids 3.   The examination was otherwise normal  RECOMMENDATIONS: 1.  If the polyp(s) removed today are proven to be adenomatous (pre-cancerous) polyps, you will need a repeat colonoscopy in 5 years.  Otherwise you should continue to follow colorectal cancer screening guidelines for "routine risk" patients with colonoscopy in 10 years.  You will receive a letter within 1-2 weeks with the results of your biopsy as well as final recommendations.  Please call my office if you have not received a letter after 3 weeks. 2.  Await pathology results  eSigned:  Harl Bowie, MD 03/16/2015 2:24 PM

## 2015-03-17 ENCOUNTER — Telehealth: Payer: Self-pay

## 2015-03-17 NOTE — Telephone Encounter (Signed)
  Follow up Call-  Call back number 03/16/2015  Post procedure Call Back phone  # [phone]  Permission to leave phone message Yes     Patient questions:  Do you have a fever, pain , or abdominal swelling? No. Pain Score  0 *  Have you tolerated food without any problems? Yes.    Have you been able to return to your normal activities? Yes.    Do you have any questions about your discharge instructions: Diet   No. Medications  No. Follow up visit  No.  Do you have questions or concerns about your Care? No.  Actions: * If pain score is 4 or above: No action needed, pain <4.

## 2015-03-29 ENCOUNTER — Encounter: Payer: Self-pay | Admitting: Gastroenterology

## 2015-10-01 ENCOUNTER — Emergency Department (HOSPITAL_COMMUNITY): Payer: BC Managed Care – PPO

## 2015-10-01 ENCOUNTER — Emergency Department (HOSPITAL_COMMUNITY)
Admission: EM | Admit: 2015-10-01 | Discharge: 2015-10-01 | Disposition: A | Discharge status: Home / Self Care | Payer: BC Managed Care – PPO | Attending: Emergency Medicine | Admitting: Emergency Medicine

## 2015-10-01 ENCOUNTER — Telehealth: Payer: Self-pay | Admitting: Cardiovascular Disease

## 2015-10-01 ENCOUNTER — Encounter (HOSPITAL_COMMUNITY): Payer: Self-pay | Admitting: *Deleted

## 2015-10-01 DIAGNOSIS — I10 Essential (primary) hypertension: Secondary | ICD-10-CM | POA: Insufficient documentation

## 2015-10-01 DIAGNOSIS — R002 Palpitations: Secondary | ICD-10-CM | POA: Diagnosis not present

## 2015-10-01 DIAGNOSIS — R10819 Abdominal tenderness, unspecified site: Secondary | ICD-10-CM | POA: Diagnosis not present

## 2015-10-01 DIAGNOSIS — Z87891 Personal history of nicotine dependence: Secondary | ICD-10-CM | POA: Insufficient documentation

## 2015-10-01 DIAGNOSIS — R200 Anesthesia of skin: Secondary | ICD-10-CM | POA: Diagnosis present

## 2015-10-01 DIAGNOSIS — R110 Nausea: Secondary | ICD-10-CM | POA: Diagnosis not present

## 2015-10-01 DIAGNOSIS — R202 Paresthesia of skin: Secondary | ICD-10-CM | POA: Diagnosis not present

## 2015-10-01 DIAGNOSIS — F909 Attention-deficit hyperactivity disorder, unspecified type: Secondary | ICD-10-CM | POA: Insufficient documentation

## 2015-10-01 DIAGNOSIS — R6889 Other general symptoms and signs: Secondary | ICD-10-CM | POA: Diagnosis not present

## 2015-10-01 DIAGNOSIS — Z9889 Other specified postprocedural states: Secondary | ICD-10-CM

## 2015-10-01 DIAGNOSIS — Z8589 Personal history of malignant neoplasm of other organs and systems: Secondary | ICD-10-CM | POA: Insufficient documentation

## 2015-10-01 DIAGNOSIS — T1590XA Foreign body on external eye, part unspecified, unspecified eye, initial encounter: Secondary | ICD-10-CM

## 2015-10-01 LAB — COMPREHENSIVE METABOLIC PANEL
ALT: 42 U/L (ref 14–54)
ANION GAP: 7 (ref 5–15)
AST: 33 U/L (ref 15–41)
Albumin: 4.5 g/dL (ref 3.5–5.0)
Alkaline Phosphatase: 82 U/L (ref 38–126)
BUN: <5 mg/dL — ABNORMAL LOW (ref 6–20)
CHLORIDE: 106 mmol/L (ref 101–111)
CO2: 28 mmol/L (ref 22–32)
Calcium: 9.5 mg/dL (ref 8.9–10.3)
Creatinine, Ser: 0.68 mg/dL (ref 0.44–1.00)
GFR calc Af Amer: >60 mL/min (ref >60)
Glucose, Bld: 95 mg/dL (ref 65–99)
POTASSIUM: 3.7 mmol/L (ref 3.5–5.1)
Sodium: 141 mmol/L (ref 135–145)
TOTAL PROTEIN: 7.1 g/dL (ref 6.5–8.1)
Total Bilirubin: 0.5 mg/dL (ref 0.3–1.2)

## 2015-10-01 LAB — URINALYSIS, ROUTINE W REFLEX MICROSCOPIC
Bilirubin Urine: NEGATIVE
Glucose, UA: NEGATIVE mg/dL
Hgb urine dipstick: NEGATIVE
Ketones, ur: NEGATIVE mg/dL
LEUKOCYTES UA: NEGATIVE
NITRITE: NEGATIVE
PROTEIN: NEGATIVE mg/dL
SPECIFIC GRAVITY, URINE: 1.008 (ref 1.005–1.030)
pH: 7.5 (ref 5.0–8.0)

## 2015-10-01 LAB — I-STAT TROPONIN, ED: TROPONIN I, POC: 0 ng/mL (ref 0.00–0.08)

## 2015-10-01 LAB — CBC
HCT: 41.7 % (ref 36.0–46.0)
HEMOGLOBIN: 13.7 g/dL (ref 12.0–15.0)
MCH: 31.4 pg (ref 26.0–34.0)
MCHC: 32.9 g/dL (ref 30.0–36.0)
MCV: 95.6 fL (ref 78.0–100.0)
PLATELETS: 264 10*3/uL (ref 150–400)
RBC: 4.36 MIL/uL (ref 3.87–5.11)
RDW: 12.2 % (ref 11.5–15.5)
WBC: 6.5 10*3/uL (ref 4.0–10.5)

## 2015-10-01 LAB — I-STAT BETA HCG BLOOD, ED (MC, WL, AP ONLY)

## 2015-10-01 LAB — D-DIMER, QUANTITATIVE (NOT AT ARMC): D DIMER QUANT: 0.28 ug{FEU}/mL (ref 0.00–0.50)

## 2015-10-01 LAB — PHENOBARBITAL LEVEL: Phenobarbital: 33.6 ug/mL — ABNORMAL HIGH (ref 15.0–30.0)

## 2015-10-01 MED ORDER — PROMETHAZINE HCL 25 MG PO TABS: 25.0000 mg | ORAL_TABLET | Freq: Four times a day (QID) | ORAL | 0 refills | Status: DC | PRN

## 2015-10-01 MED ORDER — SODIUM CHLORIDE 0.9 % IV BOLUS (SEPSIS)
1000.0000 mL | Freq: Once | INTRAVENOUS | Status: AC
  Administered 2015-10-01: 1000 mL via INTRAVENOUS

## 2015-10-01 NOTE — ED Provider Notes (Signed)
[facility] DEPT Provider Note   CSN: [id number] Arrival date & time: 10/01/15  [id number]  First Provider Contact:  First MD Initiated Contact with Patient 10/01/15 1055        History   Chief Complaint Chief Complaint  Patient presents with  . Numbness  . Blurred Vision    HPI   [name] is an 60 y.o. female with history of seizures (on phenobarbital), WPW, anxiety, IBS who presents to the ED for evaluation of multiple complaints. She states five days ago she did a "sea salt cleanse" in an effort to lose weight. She states she dissolved 2 tbsp of pure sea salt in an 8oz glass of water and chugged it. Since then she has not felt well. She has had palpitation and shortness of breath. She states her heart will feel like it's pounding and "off beat" and she cannot take a deep breath. She states a few days ago she felt a "strong electric shock down her left arm and leg" and since then has felt generally weak. She states she feels nauseated with poor appetite. She has had diarrhea for he past several days. She states she has had several auras over the past week but has not had a seizure. She cannot explain what her auras feel like. Last seizure was in January of this year. She states she just generally feels very unwell. She denies headache or blurred vision though states the other day her left eye felt "funny". She states she intermittently will feel tingly or numb in her arms and legs.   Past Medical History:  Diagnosis Date  . Abnormal heart rhythm    WPW pattern w/delta waves oon EKG  . Osteoarthritis   . Seizures [facility])     Patient Active Problem List   Diagnosis Date Noted  . Tachycardia 09/18/2013  . HTN (hypertension) 09/18/2013  . ADHD (attention deficit hyperactivity disorder) 09/12/2012  . Seasonal allergic rhinitis 06/04/2011  . Allergic rhinitis 06/04/2011  . Obstructive sleep apnea 05/06/2011  . GERD (gastroesophageal reflux disease) 05/06/2011  . Esophageal reflux  05/06/2011  . Personal history of malignant carcinoid tumor 02/08/2010  . Personal history of malignant neuroendocrine tumor 02/08/2010  . COLONIC POLYPS, HX OF 02/07/2010  . History of colonic polyps 02/07/2010  . IRRITABLE BOWEL SYNDROME 12/24/2008  . ABDOMINAL PAIN, RIGHT LOWER QUADRANT 12/23/2008  . SEIZURES, HX OF 12/23/2008    Past Surgical History:  Procedure Laterality Date  . ABDOMINAL HYSTERECTOMY  1998  . cyst removed     right wrist  . NASAL SINUS SURGERY    . NM MYOCAR PERF WALL MOTION  04/2010   bruce myoview - perfusion defects (r/t breast attenuation and/or diaphragmatic attenuation), no significant ischemia, EF 72%, ECG positive for ischemia (~22mm horizontal ST depression in V3-5 w/some in V2; low risk scan  . OOPHORECTOMY  2001 or 2003  . TRANSTHORACIC ECHOCARDIOGRAM  09/2009   EF=>55%, possible mild anterior mitral leaflet thickening, trace MR; mild TR; trace pulm valve regurg    OB History    No data available       Home Medications    Prior to Admission medications   Medication Sig Start Date End Date Taking? Authorizing Provider  ALPRAZolam Duanne Moron) 1 MG tablet Take 1 mg by mouth 2 (two) times daily.   Yes Historical Provider, MD  Cholecalciferol (VITAMIN D3) 3000 units TABS Take by mouth.   Yes Historical Provider, MD  meloxicam (MOBIC) 15 MG tablet Take 15 mg  by mouth daily as needed for pain.    Yes Historical Provider, MD  PHENobarbital (LUMINAL) 64.8 MG tablet Take 194.4 mg by mouth at bedtime.  02/16/12  Yes Ricard Dillon, MD    Family History Family History  Problem Relation Age of Onset  . Rheum arthritis Mother   . Heart Problems Mother     palpitations  . Skin cancer Mother     melanoma  . Diabetes Father   . Heart disease Father     also ruptured gallbladder  . Cancer Sister     unknown type-kind was shutting organs down  . Sleep apnea Sister   . Asthma Brother     bronchitis  . Diabetes type II Brother     Social  History Social History  Substance Use Topics  . Smoking status: Former Smoker    Packs/day: 2.50    Years: 35.00    Types: Cigarettes    Quit date: 03/06/2008  . Smokeless tobacco: Never Used  . Alcohol use No     Allergies   Linzess [linaclotide]; Indomethacin; Sulfamethoxazole; and Sulfonamide derivatives   Review of Systems Review of Systems 10 Systems reviewed and are negative for acute change except as noted in the HPI.   Physical Exam Updated Vital Signs BP 136/79 (BP Location: Left Arm)   Pulse 100   Temp 99.3 F (37.4 C) (Oral)   Resp 16   SpO2 99%   Physical Exam  Constitutional: She is oriented to person, place, and time.  Appears uncomfortable, anxious  HENT:  Right Ear: External ear normal.  Left Ear: External ear normal.  Nose: Nose normal.  Mouth/Throat: Oropharynx is clear and moist. No oropharyngeal exudate.  Eyes: Conjunctivae and EOM are normal. Pupils are equal, round, and reactive to light.  Neck: Normal range of motion. Neck supple.  Cardiovascular: Regular rhythm, normal heart sounds and intact distal pulses.  Tachycardia present.   Pulmonary/Chest: Effort normal and breath sounds normal. No respiratory distress. She has no wheezes.  Abdominal: Soft. Bowel sounds are normal. She exhibits no distension. There is no rebound and no guarding.  Mild diffuse tenderness without guarding  Musculoskeletal: She exhibits no edema.  No LE edema or calf tenderness  Neurological: She is alert and oriented to person, place, and time. No cranial nerve deficit.  5/5 strength bilateral UE and LE Normal finger to nose No pronator drift  Skin: Skin is warm and dry.  Psychiatric: Her mood appears anxious.  Nursing note and vitals reviewed.    ED Treatments / Results  Labs (all labs ordered are listed, but only abnormal results are displayed) Labs Reviewed  COMPREHENSIVE METABOLIC PANEL - Abnormal; Notable for the following:       Result Value   BUN <5 (*)     All other components within normal limits  PHENOBARBITAL LEVEL - Abnormal; Notable for the following:    Phenobarbital 33.6 (*)    All other components within normal limits  CBC  URINALYSIS, ROUTINE W REFLEX MICROSCOPIC (NOT AT [facility])  D-DIMER, QUANTITATIVE (NOT AT [facility])  I-STAT TROPOININ, ED  I-STAT BETA HCG BLOOD, ED ([facility], [facility], [facility] ONLY)    EKG  EKG Interpretation  Date/Time:  Friday October 01 2015 09:37:51 EDT Ventricular Rate:  100 PR Interval:  84 QRS Duration: 98 QT Interval:  360 QTC Calculation: 464 R Axis:   -12 Text Interpretation:  Sinus rhythm with short PR Nonspecific ST abnormality Abnormal ECG Confirmed by Hazle Coca [phone]) on  10/01/2015 11:37:02 AM       Radiology Dg Eye Foreign Body  Result Date: 10/01/2015 CLINICAL DATA:  Makeup artifact removed from eye at urgent care; clearance prior to MRI EXAM: ORBITS FOR FOREIGN BODY - 2 VIEW COMPARISON:  None. FINDINGS: There is no evidence of metallic foreign body within the orbits. No significant bone abnormality identified. IMPRESSION: No evidence of metallic foreign body within the orbits. Electronically Signed   By: Julian Hy M.D.   On: 10/01/2015 14:51  Dg Chest 2 View  Result Date: 10/01/2015 CLINICAL DATA:  Cardiac palpitations for 5 days EXAM: CHEST  2 VIEW COMPARISON:  January 06, 2010 FINDINGS: There is no edema or consolidation. There is mild scarring in the right lower lobe. Heart size and pulmonary vascularity are normal. No adenopathy. Note that there are monitor leads overlying the upper chest bilaterally. There is a sclerotic focus in right humeral head, likely a small prior infarct. IMPRESSION: No edema or consolidation.  Stable scarring right lower lobe. Electronically Signed   By: Lowella Grip III M.D.   On: 10/01/2015 10:43  Mr Brain Wo Contrast  Result Date: 10/01/2015 CLINICAL DATA:  LEFT arm numbness and blurred vision. Evaluate for stroke. Symptoms began earlier today EXAM: MRI HEAD  WITHOUT CONTRAST TECHNIQUE: Multiplanar, multiecho pulse sequences of the brain and surrounding structures were obtained without intravenous contrast. COMPARISON:  None. FINDINGS: No evidence for acute infarction, hemorrhage, mass lesion, hydrocephalus, or extra-axial fluid. Mild cerebral and cerebellar atrophy. No significant white matter disease. Pituitary, pineal, and cerebellar tonsils unremarkable. No upper cervical lesions. Flow voids are maintained throughout the carotid, basilar, and vertebral arteries. There are no areas of chronic hemorrhage. Visualized calvarium, skull base, and upper cervical osseous structures unremarkable. Scalp and extracranial soft tissues, orbits, sinuses, and mastoids show no acute process. IMPRESSION: Mild atrophy.  No acute intracranial findings. Specifically no evidence for cerebral infarction Electronically Signed   By: Staci Righter M.D.   On: 10/01/2015 16:00   Procedures Procedures (including critical care time)  Medications Ordered in ED Medications  sodium chloride 0.9 % bolus 1,000 mL (0 mLs Intravenous Stopped 10/01/15 1402)     Initial Impression / Assessment and Plan / ED Course  I have reviewed the triage vital signs and the nursing notes.  Pertinent labs & imaging results that were available during my care of the patient were reviewed by me and considered in my medical decision making (see chart for details).  Workup unrevealing. Labs unremarkable. Phenobarb level just higher than reference range at 33.6. No electrolyte derangements. MRI brain was obtained given unclear etiology of symptoms and was also negative. Pt feels better though not quite 100% in the ED. However, she is nontoxic appearing and hemodynamically stable. Her exam is nonfocal. Possibly could have had some electrolyte drangements and altered level of phenobarbital earlier in the week, now resolving. Will d/c home with instructions for close outpatient f/u with PCP and neuro (Dr Jacelyn Grip  at [facility]). Strict ER return precautions given.  Final Clinical Impressions(s) / ED Diagnoses   Final diagnoses:  Multiple complaints  Palpitations  Nausea  Paresthesias  Numbness    New Prescriptions Discharge Medication List as of 10/01/2015  4:21 PM    START taking these medications   Details  promethazine (PHENERGAN) 25 MG tablet Take 1 tablet (25 mg total) by mouth every 6 (six) hours as needed for nausea or vomiting., Starting Fri 10/01/2015, [place] [facility]  Stasia Cavalier 10/01/15 1952    Quintella Reichert, MD 10/02/15 1359

## 2015-10-01 NOTE — Discharge Instructions (Signed)
Your workup was unremarkable today. Your phenobarbital level was 33.6 which is slightly high, but not likely the cause of your symptoms. Continue staying hydrated. I will give you a prescription to help with your nausea. Take all of your home medications as prescribed. Please follow up with Dr. Jacelyn Grip and your primary care provider early next week. Return to the ER for new or worsening symptoms.

## 2015-10-01 NOTE — Telephone Encounter (Signed)
Received call from patient regarding symptoms she is having. She states she is requesting to be seen by Dr. Sallyanne Kuster today. She is complaining of rapid heart rate, "out of rhythm", SOB, fatigue and near syncope. Pt states she has been 'trying everything to get my heart rate down', has been unimproved in 2-3 days. Feels it is worse today.  No recent history on file - last seen by Dr. Sallyanne Kuster in 2015.  Pt could not give me HR or BP readings.  She is not on meds for rhythm or rate control. Given her symptoms and no recent cardiology visit, I recommended ED evaluation, to which patient agreed.

## 2015-10-01 NOTE — ED Triage Notes (Signed)
Pt reports doing a sea salt cleanse on Sunday. Not feeling well since, having numbness to left arm, today having vision change to left eye. reports fatigue, diarrhea, palpitations, n/v.

## 2015-10-01 NOTE — ED Notes (Signed)
Patient still in MRI, husband updated and given drink and crackers.

## 2015-10-01 NOTE — ED Notes (Signed)
Patient transported to X-ray and then to MRI .  

## 2016-09-13 ENCOUNTER — Telehealth: Payer: Self-pay | Admitting: Cardiovascular Disease

## 2016-09-13 DIAGNOSIS — R002 Palpitations: Secondary | ICD-10-CM

## 2016-09-13 NOTE — Telephone Encounter (Signed)
New Message     Pt c/o Shortness Of Breath: STAT if SOB developed within the last 24 hours or pt is noticeably SOB on the phone  1. Are you currently SOB (can you hear that pt is SOB on the phone)? no  2. How long have you been experiencing SOB? Since Friday   3. Are you SOB when sitting or when up moving around? Both   4. Are you currently experiencing any other symptoms? Loss of vision in left eye , left leg tingling and stomach issues

## 2016-09-13 NOTE — Telephone Encounter (Signed)
Pt of Dr. Sallyanne Kuster Last OV 2015  Hx of WPW, anxiety, seizures  Received urgent call for pt c/o symptoms.  C/o SOB, stomach pains, leg tingling, & transient loss of vision Friday night.  States symptoms of SOB occurred starting Friday, this has been more or less continuous. She also had a feeling of "double vision" while at a friend's house Friday evening. She also states disorientation and that she briefly lost track of conversation she was having. Visual issues and disorientation were very brief, maybe seconds or a minute, and she drove home.  States when she drove home, her left "foot was dragging", and she was having left leg tingling. On further discussion, she notes she's had tingling in leg and associated symptoms on and off for past 2 years.   She's also c/o of stomach pains. She went to urgent care for this on Sunday, but did not report her other symptoms. She currently does not have a GI specialist as hers has retired.  She identifies stress component to all this as related to current job demands. States "I really think I need to see Dr. Sallyanne Kuster" and also wishes to go back on atenolol, which we prescribed but she hasn't taken in ~2 years.  She denies chest pain. States shortness of breath since Friday. Does not have HR or BP readings from home. States BP OK at urgent care. "Can tell" that her heart rate was fast Friday.  I did advise if she had need to follow up on stroke-like-symptoms, it would be a good idea to go to ED but if no current symptoms to contact her neurologist. Pt declined this recommendation. Prefers to see cardiology.   Informed pt I would let Dr. Sallyanne Kuster know of situation and seek advice. Will return her call.

## 2016-09-13 NOTE — Telephone Encounter (Signed)
Further discussion w patient.  She will discuss symptoms w neurology as advised.  She will wear heart monitor as advised. I explained routine for monitor wearing, w patient aware the monitor tech will go over this information in further detail at appt.  Pt aware scheduler will call to arrange 14-day event along w f/u w Dr. Sallyanne Kuster.

## 2016-09-13 NOTE — Telephone Encounter (Signed)
I doubt that her symptoms were related to a rhythm problem or other cardiac issue. Will gladly see her again, but I strongly encourage her to see a neurologist as well. If symptoms return they are best evaluated in the emergency room. Ideally, will have worn an event monitor for a couple of weeks before her office appointment with me. Please order a 14 day event monitor.

## 2016-09-15 ENCOUNTER — Telehealth: Payer: Self-pay | Admitting: Cardiovascular Disease

## 2016-09-15 NOTE — Telephone Encounter (Signed)
Closed encounter °

## 2016-09-27 ENCOUNTER — Ambulatory Visit (INDEPENDENT_AMBULATORY_CARE_PROVIDER_SITE_OTHER): Payer: BC Managed Care – PPO

## 2016-09-27 DIAGNOSIS — R002 Palpitations: Secondary | ICD-10-CM | POA: Diagnosis not present

## 2016-10-19 ENCOUNTER — Telehealth: Payer: Self-pay | Admitting: Cardiovascular Disease

## 2016-10-19 NOTE — Telephone Encounter (Signed)
New message ° ° °Pt is returning call about monitor results.  °

## 2016-10-19 NOTE — Telephone Encounter (Signed)
Notes recorded by Diana Eves, CMA on 10/19/2016 at 1:43 PM EDT lmtcb

## 2016-10-20 ENCOUNTER — Encounter: Payer: Self-pay | Admitting: Cardiovascular Disease

## 2016-10-20 ENCOUNTER — Ambulatory Visit (INDEPENDENT_AMBULATORY_CARE_PROVIDER_SITE_OTHER): Payer: BC Managed Care – PPO | Admitting: Cardiovascular Disease

## 2016-10-20 VITALS — BP 122/78 | HR 76 | Ht 65.0 in | Wt 156.0 lb

## 2016-10-20 DIAGNOSIS — I456 Pre-excitation syndrome: Secondary | ICD-10-CM | POA: Diagnosis not present

## 2016-10-20 DIAGNOSIS — I493 Ventricular premature depolarization: Secondary | ICD-10-CM | POA: Insufficient documentation

## 2016-10-20 MED ORDER — ATENOLOL 25 MG PO TABS: 25.0000 mg | ORAL_TABLET | Freq: Every day | ORAL | 3 refills | Status: DC

## 2016-10-20 NOTE — Progress Notes (Signed)
Cardiology Office Note:    Date:  10/20/2016   ID:  [name], DOB 1955/11/03, MRN [id number]  PCP:  Everardo Beals, NP  Cardiologist:  Sanda Klein, MD    Referring MD: Everardo Beals, NP   Chief Complaint  Patient presents with  . Follow-up    2-3 week follow up after monitor.  Palpitations, evaluate event monitor  History of Present Illness:    [name] is a 60 y.o. female with a hx of Suspected SVT and a WPW pattern on ECG, recently troubled by palpitations, manifesting primarily as very brief episodes of shortness of breath. They're associated with increased stress at work. "She has to do the work of 5 other people". Symptoms are clearly worse when she becomes more emotional, better when she relaxes. Her event monitor did not show any evidence of SVT, but showed frequent PVCs that seem to be associated with her complaints.  She does not notice any exertional pattern to her symptoms. She can climb a flight of stairs or do housework such as vacuuming without shortness of breath. She is mostly limited by bilateral knee pain. She has had to have lumbar spine injections by Dr.Ramos as well as injections for a labral tear in her left hip She denies syncope, leg edema, claudication or focal neurological complaints.  She has a history of normal nuclear stress test in 2012 and normal echo in 2015 (EF 79-48%, normal diastolic function). She took atenolol 25 mg daily for several years for palpitations, with good results.  Past Medical History:  Diagnosis Date  . Abnormal heart rhythm    WPW pattern w/delta waves oon EKG  . Osteoarthritis   . Seizures ([place])     Past Surgical History:  Procedure Laterality Date  . ABDOMINAL HYSTERECTOMY  1998  . cyst removed     right wrist  . NASAL SINUS SURGERY    . NM MYOCAR PERF WALL MOTION  04/2010   bruce myoview - perfusion defects (r/t breast attenuation and/or diaphragmatic attenuation), no significant ischemia, EF 72%, ECG  positive for ischemia (~91mm horizontal ST depression in V3-5 w/some in V2; low risk scan  . OOPHORECTOMY  2001 or 2003  . TRANSTHORACIC ECHOCARDIOGRAM  09/2009   EF=>55%, possible mild anterior mitral leaflet thickening, trace MR; mild TR; trace pulm valve regurg    Current Medications: Current Meds  Medication Sig  . ALPRAZolam (XANAX) 1 MG tablet Take 1 mg by mouth 2 (two) times daily.  . Cholecalciferol (VITAMIN D3) 3000 units TABS Take by mouth.  . meloxicam (MOBIC) 15 MG tablet Take 15 mg by mouth daily as needed for pain.   Marland Kitchen PHENobarbital (LUMINAL) 64.8 MG tablet Take 194.4 mg by mouth at bedtime.      Allergies:   Linzess [linaclotide]; Indomethacin; Sulfamethoxazole; and Sulfonamide derivatives   Social History   Social History  . Marital status: Married    Spouse name: N/A  . Number of children: 2  . Years of education: associates   Occupational History  . quit job-illness;was property mgmt Homemaker   Social History Main Topics  . Smoking status: Former Smoker    Packs/day: 2.50    Years: 35.00    Types: Cigarettes    Quit date: 03/06/2008  . Smokeless tobacco: Never Used  . Alcohol use No  . Drug use: No  . Sexual activity: Not Asked   Other Topics Concern  . None   Social History Narrative  . None  Family History: The patient's family history includes Asthma in her brother; Cancer in her sister; Diabetes in her father; Diabetes type II in her brother; Heart Problems in her mother; Heart disease in her father; Rheum arthritis in her mother; Skin cancer in her mother; Sleep apnea in her sister. ROS:   Please see the history of present illness.     All other systems reviewed and are negative.  EKGs/Labs/Other Studies Reviewed:    The following studies were reviewed today: 30 day event monitor  EKG:  EKG is not ordered today.  EKG ordered 10/02/2015 shows sinus rhythm with a short PR and roughly 85 ms and delta wave is most visible across the  anteroseptal precordial leads   Physical Exam:    VS:  BP 122/78   Pulse 76   Ht 5\' 5"  (1.651 m)   Wt 156 lb (70.8 kg)   BMI 25.96 kg/m     Wt Readings from Last 3 Encounters:  10/20/16 156 lb (70.8 kg)  03/16/15 153 lb (69.4 kg)  03/04/15 153 lb 2 oz (69.5 kg)     GEN:  Well nourished, well developed in no acute distress HEENT: Normal NECK: No JVD; No carotid bruits LYMPHATICS: No lymphadenopathy CARDIAC: RRR, no murmurs, rubs, gallops RESPIRATORY:  Clear to auscultation without rales, wheezing or rhonchi  ABDOMEN: Soft, non-tender, non-distended MUSCULOSKELETAL:  No edema; No deformity  SKIN: Warm and dry NEUROLOGIC:  Alert and oriented x 3 PSYCHIATRIC:  Normal affect   ASSESSMENT:    1. PVC's (premature ventricular contractions)   2. Wolff-Parkinson-White (WPW) pattern seen on electrocardiography    PLAN:    In order of problems listed above:  1. PVCs: It sounds like her symptoms are related to post PVC pauses. Will resume her beta blocker. I would recommend using the lowest effective dose and she should start with 12.5 mg once daily but can increase to 25 mg daily. Avoid abrupt cessation of the beta blocker in the future. Call us back if this does not provide satisfactory palliation of symptoms 2. WPW: Is fairly convincing evidence of preexcitation on her ECG but without recent episodes of SVT. No SVT was recorded during symptoms when she were the event monitor.   Medication Adjustments/Labs and Tests Ordered: Current medicines are reviewed at length with the patient today.  Concerns regarding medicines are outlined above.  No orders of the defined types were placed in this encounter.  Meds ordered this encounter  Medications  . atenolol (TENORMIN) 25 MG tablet    Sig: Take 1 tablet (25 mg total) by mouth daily.    Dispense:  90 tablet    Refill:  3    Signed, Sanda Klein, MD  10/20/2016 5:39 PM    [place]

## 2016-10-20 NOTE — Patient Instructions (Signed)
Dr Sallyanne Kuster has recommended making the following medication changes: 1. START Atenolol 25 mg - take 1 tablet by mouth daily  Your physician recommends that you schedule a follow-up appointment in 12 months. You will receive a reminder letter in the mail two months in advance. If you don't receive a letter, please call our office to schedule the follow-up appointment.  If you need a refill on your cardiac medications before your next appointment, please call your pharmacy.

## 2016-10-27 NOTE — Telephone Encounter (Signed)
Notes recorded by Diana Eves, CMA on 10/26/2016 at 11:57 AM EDT Results reviewed with patient at office visit, 10/20/16.

## 2017-06-19 ENCOUNTER — Ambulatory Visit: Payer: BC Managed Care – PPO | Admitting: Physician Assistant

## 2017-06-19 ENCOUNTER — Telehealth: Payer: Self-pay | Admitting: Gastroenterology

## 2017-06-19 ENCOUNTER — Other Ambulatory Visit (INDEPENDENT_AMBULATORY_CARE_PROVIDER_SITE_OTHER): Payer: BC Managed Care – PPO

## 2017-06-19 ENCOUNTER — Telehealth: Payer: Self-pay | Admitting: Physician Assistant

## 2017-06-19 ENCOUNTER — Encounter: Payer: Self-pay | Admitting: Physician Assistant

## 2017-06-19 VITALS — BP 114/74 | HR 84 | Ht 65.0 in | Wt 154.5 lb

## 2017-06-19 DIAGNOSIS — K921 Melena: Secondary | ICD-10-CM | POA: Diagnosis not present

## 2017-06-19 DIAGNOSIS — R12 Heartburn: Secondary | ICD-10-CM

## 2017-06-19 DIAGNOSIS — R1013 Epigastric pain: Secondary | ICD-10-CM | POA: Diagnosis not present

## 2017-06-19 DIAGNOSIS — R197 Diarrhea, unspecified: Secondary | ICD-10-CM

## 2017-06-19 LAB — CBC WITH DIFFERENTIAL/PLATELET
BASOS ABS: 0.1 10*3/uL (ref 0.0–0.1)
BASOS PCT: 0.8 % (ref 0.0–3.0)
EOS ABS: 0 10*3/uL (ref 0.0–0.7)
Eosinophils Relative: 0.3 % (ref 0.0–5.0)
HEMATOCRIT: 39.8 % (ref 36.0–46.0)
HEMOGLOBIN: 13.4 g/dL (ref 12.0–15.0)
LYMPHS PCT: 32 % (ref 12.0–46.0)
Lymphs Abs: 2.4 10*3/uL (ref 0.7–4.0)
MCHC: 33.6 g/dL (ref 30.0–36.0)
MCV: 95.4 fl (ref 78.0–100.0)
Monocytes Absolute: 0.5 10*3/uL (ref 0.1–1.0)
Monocytes Relative: 7.4 % (ref 3.0–12.0)
Neutro Abs: 4.4 10*3/uL (ref 1.4–7.7)
Neutrophils Relative %: 59.5 % (ref 43.0–77.0)
Platelets: 254 10*3/uL (ref 150.0–400.0)
RBC: 4.18 Mil/uL (ref 3.87–5.11)
RDW: 12.7 % (ref 11.5–15.5)
WBC: 7.4 10*3/uL (ref 4.0–10.5)

## 2017-06-19 LAB — COMPREHENSIVE METABOLIC PANEL
ALBUMIN: 4.4 g/dL (ref 3.5–5.2)
ALK PHOS: 85 U/L (ref 39–117)
ALT: 25 U/L (ref 0–35)
AST: 21 U/L (ref 0–37)
BUN: 6 mg/dL (ref 6–23)
CALCIUM: 9 mg/dL (ref 8.4–10.5)
CO2: 28 mEq/L (ref 19–32)
CREATININE: 0.66 mg/dL (ref 0.40–1.20)
Chloride: 104 mEq/L (ref 96–112)
GFR: 96.56 mL/min (ref >60.00)
Glucose, Bld: 89 mg/dL (ref 70–99)
Potassium: 3.1 mEq/L — ABNORMAL LOW (ref 3.5–5.1)
Sodium: 140 mEq/L (ref 135–145)
Total Bilirubin: 0.3 mg/dL (ref 0.2–1.2)
Total Protein: 7.1 g/dL (ref 6.0–8.3)

## 2017-06-19 LAB — LIPASE: Lipase: 31 U/L (ref 11.0–59.0)

## 2017-06-19 MED ORDER — PANTOPRAZOLE SODIUM 40 MG PO TBEC: 40.0000 mg | DELAYED_RELEASE_TABLET | Freq: Two times a day (BID) | ORAL | 2 refills | Status: DC

## 2017-06-19 MED ORDER — POTASSIUM CHLORIDE 20 MEQ PO PACK: 20.0000 meq | PACK | Freq: Every day | ORAL | 0 refills | Status: DC

## 2017-06-19 NOTE — Telephone Encounter (Signed)
Called patient back and she states she just received a phone call from her boss that she did receive the fax.

## 2017-06-19 NOTE — Patient Instructions (Signed)
We have sent the following medications to your pharmacy for you to pick up at your convenience:  Potassium 20 meq daily for the next 3 days.   Pantoprazole 40 mg twice a day  Go to the basement today to pick up container for stool test. And return to have lab work done in one week.    You have been scheduled for a CT scan of the abdomen and pelvis at [facility] ([address]---this is in the same building as Press photographer).   You are scheduled on 06-21-17 at 10:30 am. You should arrive 15 minutes prior to your appointment time for registration. Please follow the written instructions below on the day of your exam:  WARNING: IF YOU ARE ALLERGIC TO IODINE/X-RAY DYE, PLEASE NOTIFY RADIOLOGY IMMEDIATELY AT [phone]! YOU WILL BE GIVEN A 13 HOUR PREMEDICATION PREP.  1) Do not eat or drink anything after 6:30 am (4 hours prior to your test) 2) You have been given 2 bottles of oral contrast to drink. The solution may taste better if refrigerated, but do NOT add ice or any other liquid to this solution. Shake well before drinking.    Drink 1 bottle of contrast @ 8:30 am (2 hours prior to your exam)  Drink 1 bottle of contrast @ 9:30 am (1 hour prior to your exam)  You may take any medications as prescribed with a small amount of water except for the following: Metformin, Glucophage, Glucovance, Avandamet, Riomet, Fortamet, Actoplus Met, Janumet, Glumetza or Metaglip. The above medications must be held the day of the exam AND 48 hours after the exam.  The purpose of you drinking the oral contrast is to aid in the visualization of your intestinal tract. The contrast solution may cause some diarrhea. Before your exam is started, you will be given a small amount of fluid to drink. Depending on your individual set of symptoms, you may also receive an intravenous injection of x-ray contrast/dye. Plan on being at [facility] for 30 minutes or longer, depending on the type of  exam you are having performed.  This test typically takes 30-45 minutes to complete.  If you have any questions regarding your exam or if you need to reschedule, you may call the CT department at [phone] between the hours of 8:00 am and 5:00 pm, Monday-Friday.  ________________________________________________________________________

## 2017-06-19 NOTE — Telephone Encounter (Signed)
Office is scheduled for today.

## 2017-06-19 NOTE — Telephone Encounter (Signed)
Spoke with the patient who states she has not been feeling well for a week. Symptoms are progressively worsening. She has liquid stool every time she tried to eat. Stool is described as "liquid and black."  "Everything goes right through me." She is only eating "bites" at this time. Trying to drink water or tea. Feels nauseous, not hungry and "a nervous stomach."  Afebrile.  She has tried Alka-Seltzer one time and Pepto-Bismol one time. Minimal relief. She has developed incontinence of her bowels now. Agrees to not take any more OTC for now. She will come for evaluation.

## 2017-06-19 NOTE — Progress Notes (Signed)
Chief Complaint: Loose black stools, epigastric pain  HPI:    [name] is a 62 year old female with a past medical history as listed below follows with Dr. Silverio Decamp and presents to clinic today with a complaint of loose black stools and epigastric pain.    03/16/15 colonoscopy with sessile polyp at the cecum, small internal hemorrhoids and otherwise normal exam.  Adenomatous pathology with repeat recommended in 5 years.    Today, explains that over the past month she has had sporadic "fecal incontinence".  Describes this as "skid marks in my underwear or looking like jelly".  She did not pay this much attention until over the past week when she developed constant loose stool noting at least 3 watery stools per day.  Described this is very different from her normal IBS-C.  Then 06/15/17 started feeling "sick" describing feeling nausea and lethargy.  Over the weekend had no appetite and just "laid around" could not eat anything but was trying to drink water to stay hydrated.  Yesterday, she awoke and felt very sick and rushed to the toilet to have a black tarry liquid "burst" of stool.  She then took some Pepto-Bismol and called out of work and laid around and drink a lot of water.  Last night she finally tried some solid food but only had a few bites before feeling very full.  Again woke this morning with a black tarry liquid stool. Associated epigastric pain.    Describes chronic heartburn when eating anything multiple times a day.  This has been going on for months.  Denies NSAID use.    Social history positive for an extremely stressful work life recently with a change in her boss who has been firing people.    Denies fever or chills.  Past Medical History:  Diagnosis Date  . Abnormal heart rhythm    WPW pattern w/delta waves oon EKG  . Osteoarthritis   . Seizures ([place])     Past Surgical History:  Procedure Laterality Date  . ABDOMINAL HYSTERECTOMY  1998  . cyst removed     right wrist    . NASAL SINUS SURGERY    . NM MYOCAR PERF WALL MOTION  04/2010   bruce myoview - perfusion defects (r/t breast attenuation and/or diaphragmatic attenuation), no significant ischemia, EF 72%, ECG positive for ischemia (~62mm horizontal ST depression in V3-5 w/some in V2; low risk scan  . OOPHORECTOMY  2001 or 2003  . TRANSTHORACIC ECHOCARDIOGRAM  09/2009   EF=>55%, possible mild anterior mitral leaflet thickening, trace MR; mild TR; trace pulm valve regurg    Current Outpatient Medications  Medication Sig Dispense Refill  . ALPRAZolam (XANAX) 1 MG tablet Take 1 mg by mouth 2 (two) times daily.    . Cholecalciferol (VITAMIN D3) 3000 units TABS Take by mouth.    Marland Kitchen PHENobarbital (LUMINAL) 64.8 MG tablet Take 194.4 mg by mouth at bedtime.     Marland Kitchen atenolol (TENORMIN) 25 MG tablet Take 1 tablet (25 mg total) by mouth daily. 90 tablet 3   No current facility-administered medications for this visit.     Allergies as of 06/19/2017 - Review Complete 06/19/2017  Allergen Reaction Noted  . Linzess [linaclotide] Diarrhea 03/16/2015  . Indomethacin  08/24/2011  . Sulfamethoxazole Swelling and Hives 07/25/2013  . Sulfonamide derivatives  12/23/2008    Family History  Problem Relation Age of Onset  . Rheum arthritis Mother   . Heart Problems Mother        palpitations  .  Skin cancer Mother        melanoma  . Diabetes Father   . Heart disease Father        also ruptured gallbladder  . Cancer Sister        unknown type-kind was shutting organs down  . Sleep apnea Sister   . Asthma Brother        bronchitis  . Diabetes type II Brother     Social History   Socioeconomic History  . Marital status: Married    Spouse name: Not on file  . Number of children: 2  . Years of education: associates  . Highest education level: Not on file  Occupational History  . Occupation: legal asst.  Social Needs  . Financial resource strain: Not on file  . Food insecurity:    Worry: Not on file     Inability: Not on file  . Transportation needs:    Medical: Not on file    Non-medical: Not on file  Tobacco Use  . Smoking status: Former Smoker    Packs/day: 2.50    Years: 35.00    Pack years: 87.50    Types: Cigarettes    Last attempt to quit: 03/06/2008    Years since quitting: 9.2  . Smokeless tobacco: Never Used  Substance and Sexual Activity  . Alcohol use: No  . Drug use: No  . Sexual activity: Not on file  Lifestyle  . Physical activity:    Days per week: Not on file    Minutes per session: Not on file  . Stress: Not on file  Relationships  . Social connections:    Talks on phone: Not on file    Gets together: Not on file    Attends religious service: Not on file    Active member of club or organization: Not on file    Attends meetings of clubs or organizations: Not on file    Relationship status: Not on file  . Intimate partner violence:    Fear of current or ex partner: Not on file    Emotionally abused: Not on file    Physically abused: Not on file    Forced sexual activity: Not on file  Other Topics Concern  . Not on file  Social History Narrative  . Not on file    Review of Systems:    Constitutional: No weight loss, fever or chills Skin: No rash  Cardiovascular: No chest pain Respiratory: No SOB  Gastrointestinal: See HPI and otherwise negative Genitourinary: No dysuria  Neurological: No headache Musculoskeletal: No new muscle or joint pain Hematologic: No bruising Psychiatric: No history of depression or anxiety   Physical Exam:  Vital signs: BP 114/74   Pulse 84   Ht 5\' 5"  (1.651 m)   Wt 154 lb 8 oz (70.1 kg)   BMI 25.71 kg/m   Constitutional:   Pleasant ill appearing Caucasian female appears to be in NAD, Well developed, Well nourished, alert and cooperative Head:  Normocephalic and atraumatic. Eyes:   PEERL, EOMI. No icterus. Conjunctiva pink. Ears:  Normal auditory acuity. Neck:  Supple Throat: Oral cavity and pharynx without  inflammation, swelling or lesion.  Respiratory: Respirations even and unlabored. Lungs clear to auscultation bilaterally.   No wheezes, crackles, or rhonchi.  Cardiovascular: Normal S1, S2. No MRG. Regular rate and rhythm. No peripheral edema, cyanosis or pallor.  Gastrointestinal:  Soft, nondistended, moderate epigastric ttp. No rebound or guarding. Normal bowel sounds. No appreciable masses or hepatomegaly.  Rectal:  Not performed.  Msk:  Symmetrical without gross deformities. Without edema, no deformity or joint abnormality.  Neurologic:  Alert and  oriented x4;  grossly normal neurologically.  Skin:   Dry and intact without significant lesions or rashes. Psychiatric:  Demonstrates good judgement and reason without abnormal affect or behaviors.  CBC    Component Value Date/Time   WBC 7.4 06/19/2017 1404   RBC 4.18 06/19/2017 1404   HGB 13.4 06/19/2017 1404   HCT 39.8 06/19/2017 1404   PLT 254.0 06/19/2017 1404   MCV 95.4 06/19/2017 1404   MCH 31.4 10/01/2015 1001   MCHC 33.6 06/19/2017 1404   RDW 12.7 06/19/2017 1404   LYMPHSABS 2.4 06/19/2017 1404   MONOABS 0.5 06/19/2017 1404   EOSABS 0.0 06/19/2017 1404   BASOSABS 0.1 06/19/2017 1404   CMP     Component Value Date/Time   NA 140 06/19/2017 1404   K 3.1 (L) 06/19/2017 1404   CL 104 06/19/2017 1404   CO2 28 06/19/2017 1404   GLUCOSE 89 06/19/2017 1404   BUN 6 06/19/2017 1404   CREATININE 0.66 06/19/2017 1404   CALCIUM 9.0 06/19/2017 1404   PROT 7.1 06/19/2017 1404   ALBUMIN 4.4 06/19/2017 1404   AST 21 06/19/2017 1404   ALT 25 06/19/2017 1404   ALKPHOS 85 06/19/2017 1404   BILITOT 0.3 06/19/2017 1404   GFRNONAA >60 10/01/2015 1001   GFRAA >60 10/01/2015 1001   Lipase     Component Value Date/Time   LIPASE 31.0 06/19/2017 1404   Assessment: 1.  Epigastric pain: With below 2.  Heartburn: Daily per patient, not on any medication for this; consider GERD versus gastritis versus H. pylori 3.  Melena: 2 episodes, one  this morning and one yesterday, hemoglobin stable, patient describes Pepto-Bismol use after first episode of melena; consider enteritis versus upper GI source 4.  Diarrhea: For the past month, worse over the past week, with melena as above; question enteritis  Plan: 1.  Ordered CBC, CMP and lipase stat, which returned as above.  After labs returned discussed case with Dr. Silverio Decamp.  Hemoglobin is reassuring. 2.  Ordered potassium 20 mEq 1 tab once daily times 3 days #3.  Recheck CMP was ordered for next week. 3.  Ordered CT abdomen and pelvis to be completed this week 4.  Order GI pathogen panel and C. difficile 5.  Provided patient with a work note for the rest of the week. 6.  Pending results of above patient will likely benefit from an EGD in the future 7.  Prescribed Pantoprazole 40 mg twice daily, 30-60 minutes before breakfast and dinner #60 with 2 refills 8.  Had long discussion with the patient regarding whether or not she felt well enough to be worked up outpatient, if anything changes in her status, abdominal pain gets worse, melena increases or she feels lightheaded, dizzy or near syncopal she is to proceed to the ER. 9.  Patient will be contacted after labs and imaging above to discuss next steps  Ellouise Newer, PA-C [facility] 06/19/2017, 1:36 PM  Cc: Everardo Beals, NP

## 2017-06-19 NOTE — Telephone Encounter (Signed)
Please check Hgb/HCT and GI pathogen panel. Schedule office visit.

## 2017-06-19 NOTE — Telephone Encounter (Signed)
Dr. Woodward Ku patient

## 2017-06-20 ENCOUNTER — Other Ambulatory Visit: Payer: BC Managed Care – PPO

## 2017-06-21 ENCOUNTER — Ambulatory Visit (INDEPENDENT_AMBULATORY_CARE_PROVIDER_SITE_OTHER)
Admission: RE | Admit: 2017-06-21 | Discharge: 2017-06-21 | Disposition: A | Discharge status: Home / Self Care | Payer: BC Managed Care – PPO | Source: Ambulatory Visit | Attending: Physician Assistant | Admitting: Physician Assistant

## 2017-06-21 ENCOUNTER — Telehealth: Payer: Self-pay | Admitting: Physician Assistant

## 2017-06-21 ENCOUNTER — Other Ambulatory Visit: Payer: BC Managed Care – PPO

## 2017-06-21 DIAGNOSIS — K921 Melena: Secondary | ICD-10-CM | POA: Diagnosis not present

## 2017-06-21 DIAGNOSIS — R12 Heartburn: Secondary | ICD-10-CM

## 2017-06-21 DIAGNOSIS — R197 Diarrhea, unspecified: Secondary | ICD-10-CM

## 2017-06-21 DIAGNOSIS — R1013 Epigastric pain: Secondary | ICD-10-CM

## 2017-06-21 MED ORDER — IOPAMIDOL (ISOVUE-300) INJECTION 61%
100.0000 mL | Freq: Once | INTRAVENOUS | Status: AC | PRN
  Administered 2017-06-21: 100 mL via INTRAVENOUS

## 2017-06-21 NOTE — Telephone Encounter (Signed)
Pt calling for CT results, scan was just done this am.

## 2017-06-21 NOTE — Telephone Encounter (Signed)
Patient wanting to know if results from CT today 4.18.19 can be looked at since tomorrow is a holiday.

## 2017-06-21 NOTE — Telephone Encounter (Signed)
Reviewed. JLL

## 2017-06-21 NOTE — Telephone Encounter (Signed)
See result note.  

## 2017-06-21 NOTE — Progress Notes (Signed)
F/u GI pathogen panel to exclude infectious etiology. F/u CT abd & pelvis.  Reviewed and agree with documentation and assessment and plan. Damaris Hippo , MD

## 2017-06-25 LAB — GASTROINTESTINAL PATHOGEN PANEL PCR
C. DIFFICILE TOX A/B, PCR: NOT DETECTED
CAMPYLOBACTER, PCR: NOT DETECTED
CRYPTOSPORIDIUM, PCR: NOT DETECTED
E COLI (ETEC) LT/ST, PCR: NOT DETECTED
E COLI 0157, PCR: NOT DETECTED
E coli (STEC) stx1/stx2, PCR: NOT DETECTED
GIARDIA LAMBLIA, PCR: NOT DETECTED
Norovirus, PCR: NOT DETECTED
Rotavirus A, PCR: NOT DETECTED
Salmonella, PCR: NOT DETECTED
Shigella, PCR: NOT DETECTED

## 2017-06-25 LAB — CLOSTRIDIUM DIFFICILE TOXIN B, QUALITATIVE, REAL-TIME PCR: CDIFFPCR: NOT DETECTED

## 2017-06-27 ENCOUNTER — Telehealth: Payer: Self-pay | Admitting: Physician Assistant

## 2017-06-27 NOTE — Telephone Encounter (Signed)
Pt returned your call regarding lab results. She is requesting a call back at the following numbers: From 12:00pm to 1:00pm to [phone] After 2:00pm to [phone]

## 2017-06-27 NOTE — Telephone Encounter (Signed)
Line rings busy    Notes recorded by Levin Erp, PA on 06/25/2017 at 1:29 PM EDT Stool studies negative. Please schedule her for an EGD with Dr. Silverio Decamp asap. Thank you-JLL

## 2017-06-28 ENCOUNTER — Telehealth: Payer: Self-pay | Admitting: Gastroenterology

## 2017-06-28 NOTE — Telephone Encounter (Signed)
Pt r/s egd to 07/06/17 at 4:00pm. She wants to know if she can still come on Monday at 3:30pm to do paperwork or if you want her to come on a different day. Thank you.

## 2017-06-28 NOTE — Telephone Encounter (Signed)
The pt has been advised that she can keep the appt as scheduled .

## 2017-06-28 NOTE — Telephone Encounter (Signed)
The pt has been advised and scheduled for pre visit and EGD

## 2017-06-29 ENCOUNTER — Telehealth: Payer: Self-pay

## 2017-06-29 NOTE — Telephone Encounter (Signed)
Dr. Silverio Decamp,  [name] is scheduled for Pre-Visit on Monday and an Endoscopy on 07/06/17. I noticed in preparing her chart for PV that she had a seizure on 02/26/17. I just wanted to make sure that it is ok to proceed with endoscopy at the [facility]. She is on medication for seizures. Please advise. Thanks!   Riki Sheer, LPN

## 2017-07-02 ENCOUNTER — Encounter: Payer: Self-pay | Admitting: Gastroenterology

## 2017-07-02 ENCOUNTER — Ambulatory Visit (AMBULATORY_SURGERY_CENTER): Payer: Self-pay

## 2017-07-02 VITALS — Ht 65.0 in | Wt 156.6 lb

## 2017-07-02 DIAGNOSIS — R1013 Epigastric pain: Secondary | ICD-10-CM

## 2017-07-02 NOTE — Progress Notes (Signed)
Denies allergies to eggs or soy products. Denies complication of anesthesia or sedation. Denies use of weight loss medication. Denies use of O2.   Emmi instructions declined.  

## 2017-07-03 NOTE — Telephone Encounter (Signed)
If well controlled on medication, ok to proceed. Please also check with Osvaldo Angst. Thanks

## 2017-07-04 ENCOUNTER — Encounter: Payer: BC Managed Care – PPO | Admitting: Gastroenterology

## 2017-07-06 ENCOUNTER — Ambulatory Visit (AMBULATORY_SURGERY_CENTER): Payer: BC Managed Care – PPO | Admitting: Gastroenterology

## 2017-07-06 ENCOUNTER — Other Ambulatory Visit: Payer: Self-pay

## 2017-07-06 ENCOUNTER — Encounter: Payer: Self-pay | Admitting: Gastroenterology

## 2017-07-06 VITALS — BP 118/76 | HR 71 | Temp 99.0°F | Resp 10 | Ht 65.0 in | Wt 156.0 lb

## 2017-07-06 DIAGNOSIS — R1013 Epigastric pain: Secondary | ICD-10-CM

## 2017-07-06 MED ORDER — LUBIPROSTONE 8 MCG PO CAPS: ORAL_CAPSULE | ORAL | 3 refills | Status: DC

## 2017-07-06 MED ORDER — SODIUM CHLORIDE 0.9 % IV SOLN: 500.0000 mL | Freq: Once | INTRAVENOUS | Status: AC

## 2017-07-06 NOTE — Op Note (Addendum)
[place] Patient Name: [name] Procedure Date: 07/06/2017 3:35 PM MRN: [id number] Endoscopist: Mauri Pole , MD Age: 62 Referring MD:  Date of Birth: 1956-02-22 Gender: Female Account #: [id number] Procedure:                Upper GI endoscopy Indications:              Epigastric abdominal pain, Dyspepsia Medicines:                Monitored Anesthesia Care Procedure:                Pre-Anesthesia Assessment:                           - Prior to the procedure, a History and Physical                            was performed, and patient medications and                            allergies were reviewed. The patient's tolerance of                            previous anesthesia was also reviewed. The risks                            and benefits of the procedure and the sedation                            options and risks were discussed with the patient.                            All questions were answered, and informed consent                            was obtained. Prior Anticoagulants: The patient has                            taken no previous anticoagulant or antiplatelet                            agents. ASA Grade Assessment: II - A patient with                            mild systemic disease. After reviewing the risks                            and benefits, the patient was deemed in                            satisfactory condition to undergo the procedure.                           After obtaining informed consent, the endoscope was  passed under direct vision. Throughout the                            procedure, the patient's blood pressure, pulse, and                            oxygen saturations were monitored continuously. The                            Endoscope was introduced through the mouth, and                            advanced to the second part of duodenum. The upper                            GI endoscopy was  accomplished without difficulty.                            The patient tolerated the procedure well. Scope In: Scope Out: Findings:                 Esophagogastric landmarks were identified: the                            Z-line was found at 35 cm and the site of hiatal                            narrowing was found at 37 cm from the incisors.                           A small hiatal hernia was present.                           The stomach was normal.                           The examined duodenum was normal. Complications:            No immediate complications. Estimated Blood Loss:     Estimated blood loss: none. Impression:               - Esophagogastric landmarks identified.                           - Small hiatal hernia.                           - Normal stomach.                           - Normal examined duodenum.                           - No specimens collected. Recommendation:           - Patient has a contact number available for  emergencies. The signs and symptoms of potential                            delayed complications were discussed with the                            patient. Return to normal activities tomorrow.                            Written discharge instructions were provided to the                            patient.                           - Resume previous diet.                           - Continue present medications.                           - Return to GI office PRN.                           - Patient has a contact number available for                            emergencies. The signs and symptoms of potential                            delayed complications were discussed with the                            patient. Return to normal activities tomorrow.                            Written discharge instructions were provided to the                            patient.                           - No ibuprofen,  naproxen, or other non-steroidal                            anti-inflammatory drugs.                           - Start Amitiza 97mcg BID                           - IB Gard 1 capsule TID as needed Mauri Pole, MD 07/06/2017 3:51:35 PM This report has been signed electronically.

## 2017-07-06 NOTE — Progress Notes (Signed)
No changes in medical or surgical hx since PV per pt 

## 2017-07-06 NOTE — Patient Instructions (Addendum)
YOU HAD AN ENDOSCOPIC PROCEDURE TODAY AT [facility]:   Refer to the procedure report that was given to you for any specific questions about what was found during the examination.  If the procedure report does not answer your questions, please call your gastroenterologist to clarify.  If you requested that your care partner not be given the details of your procedure findings, then the procedure report has been included in a sealed envelope for you to review at your convenience later.  YOU SHOULD EXPECT: Some feelings of bloating in the abdomen. Passage of more gas than usual.  Walking can help get rid of the air that was put into your GI tract during the procedure and reduce the bloating. If you had a lower endoscopy (such as a colonoscopy or flexible sigmoidoscopy) you may notice spotting of blood in your stool or on the toilet paper. If you underwent a bowel prep for your procedure, you may not have a normal bowel movement for a few days.  Please Note:  You might notice some irritation and congestion in your nose or some drainage.  This is from the oxygen used during your procedure.  There is no need for concern and it should clear up in a day or so.  SYMPTOMS TO REPORT IMMEDIATELY:   Following lower endoscopy (colonoscopy or flexible sigmoidoscopy):  Excessive amounts of blood in the stool  Significant tenderness or worsening of abdominal pains  Swelling of the abdomen that is new, acute  Fever of 100F or higher   Following upper endoscopy (EGD)  Vomiting of blood or coffee ground material  New chest pain or pain under the shoulder blades  Painful or persistently difficult swallowing  New shortness of breath  Fever of 100F or higher  Black, tarry-looking stools  For urgent or emergent issues, a gastroenterologist can be reached at any hour by calling [phone].   DIET:  We do recommend a small meal at first, but then you may proceed to your regular diet.  Drink  plenty of fluids but you should avoid alcoholic beverages for 24 hours.  MEDICATIONS: Continue present medications. No Ibuprofen, Naproxen, or other non-steroidal anti-inflammatory drugs. Start Amitiza 8 mcg by mouth twice daily (samples given to patient). [place] IB Gard 1 capsule 3 times daily as needed.  Please see handouts given to you by your recovery nurse.  Follow up in GI clinic as needed.  ACTIVITY:  You should plan to take it easy for the rest of today and you should NOT DRIVE or use heavy machinery until tomorrow (because of the sedation medicines used during the test).    FOLLOW UP: Our staff will call the number listed on your records the next business day following your procedure to check on you and address any questions or concerns that you may have regarding the information given to you following your procedure. If we do not reach you, we will leave a message.  However, if you are feeling well and you are not experiencing any problems, there is no need to return our call.  We will assume that you have returned to your regular daily activities without incident.  If any biopsies were taken you will be contacted by phone or by letter within the next 1-3 weeks.  Please call us at [phone] if you have not heard about the biopsies in 3 weeks.   Thank you for allowing [place] to provide for your healthcare needs today.  SIGNATURES/CONFIDENTIALITY: You and/or  your care partner have signed paperwork which will be entered into your electronic medical record.  These signatures attest to the fact that that the information above on your After Visit Summary has been reviewed and is understood.  Full responsibility of the confidentiality of this discharge information lies with you and/or your care-partner.

## 2017-07-06 NOTE — Progress Notes (Signed)
To recovery, report to RN, VSS. 

## 2017-07-09 ENCOUNTER — Telehealth: Payer: Self-pay

## 2017-07-09 NOTE — Telephone Encounter (Signed)
Called [phone] and left a messaged we tried to reach pt for a follow up call. maw

## 2017-07-09 NOTE — Telephone Encounter (Signed)
left message

## 2017-09-14 ENCOUNTER — Other Ambulatory Visit: Payer: Self-pay | Admitting: Physician Assistant

## 2017-10-17 ENCOUNTER — Other Ambulatory Visit: Payer: Self-pay | Admitting: Cardiovascular Disease

## 2017-10-17 NOTE — Telephone Encounter (Signed)
Rx request sent to pharmacy.  

## 2017-11-18 ENCOUNTER — Other Ambulatory Visit: Payer: Self-pay | Admitting: Cardiovascular Disease

## 2017-12-12 ENCOUNTER — Other Ambulatory Visit: Payer: Self-pay | Admitting: Cardiovascular Disease

## 2017-12-18 ENCOUNTER — Encounter (HOSPITAL_COMMUNITY): Payer: Self-pay | Admitting: Emergency Medicine

## 2017-12-18 ENCOUNTER — Emergency Department (HOSPITAL_COMMUNITY): Payer: BC Managed Care – PPO

## 2017-12-18 ENCOUNTER — Emergency Department (HOSPITAL_COMMUNITY)
Admission: EM | Admit: 2017-12-18 | Discharge: 2017-12-18 | Disposition: A | Discharge status: Home / Self Care | Payer: BC Managed Care – PPO | Attending: Emergency Medicine | Admitting: Emergency Medicine

## 2017-12-18 ENCOUNTER — Other Ambulatory Visit: Payer: Self-pay

## 2017-12-18 DIAGNOSIS — Y92009 Unspecified place in unspecified non-institutional (private) residence as the place of occurrence of the external cause: Secondary | ICD-10-CM | POA: Diagnosis not present

## 2017-12-18 DIAGNOSIS — S91331A Puncture wound without foreign body, right foot, initial encounter: Secondary | ICD-10-CM | POA: Diagnosis not present

## 2017-12-18 DIAGNOSIS — Y999 Unspecified external cause status: Secondary | ICD-10-CM | POA: Insufficient documentation

## 2017-12-18 DIAGNOSIS — W540XXA Bitten by dog, initial encounter: Secondary | ICD-10-CM | POA: Diagnosis not present

## 2017-12-18 DIAGNOSIS — I456 Pre-excitation syndrome: Secondary | ICD-10-CM | POA: Insufficient documentation

## 2017-12-18 DIAGNOSIS — I10 Essential (primary) hypertension: Secondary | ICD-10-CM | POA: Diagnosis not present

## 2017-12-18 DIAGNOSIS — Y9301 Activity, walking, marching and hiking: Secondary | ICD-10-CM | POA: Diagnosis not present

## 2017-12-18 DIAGNOSIS — Z23 Encounter for immunization: Secondary | ICD-10-CM | POA: Diagnosis not present

## 2017-12-18 DIAGNOSIS — Z87891 Personal history of nicotine dependence: Secondary | ICD-10-CM | POA: Diagnosis not present

## 2017-12-18 DIAGNOSIS — Z79899 Other long term (current) drug therapy: Secondary | ICD-10-CM | POA: Diagnosis not present

## 2017-12-18 MED ORDER — OXYCODONE-ACETAMINOPHEN 5-325 MG PO TABS
1.0000 | ORAL_TABLET | ORAL | Status: AC | PRN
  Administered 2017-12-18 (×2): 1 via ORAL
  Filled 2017-12-18 (×2): qty 1

## 2017-12-18 MED ORDER — HYDROCODONE-ACETAMINOPHEN 5-325 MG PO TABS: 1.0000 | ORAL_TABLET | ORAL | 0 refills | Status: DC | PRN

## 2017-12-18 MED ORDER — OXYCODONE-ACETAMINOPHEN 5-325 MG PO TABS
1.0000 | ORAL_TABLET | Freq: Once | ORAL | Status: DC
  Filled 2017-12-18: qty 1

## 2017-12-18 MED ORDER — AMOXICILLIN-POT CLAVULANATE 875-125 MG PO TABS
1.0000 | ORAL_TABLET | Freq: Once | ORAL | Status: AC
  Administered 2017-12-18: 1 via ORAL
  Filled 2017-12-18: qty 1

## 2017-12-18 MED ORDER — AMOXICILLIN-POT CLAVULANATE 875-125 MG PO TABS: 1.0000 | ORAL_TABLET | Freq: Two times a day (BID) | ORAL | 0 refills | Status: DC

## 2017-12-18 MED ORDER — TETANUS-DIPHTH-ACELL PERTUSSIS 5-2.5-18.5 LF-MCG/0.5 IM SUSP
0.5000 mL | Freq: Once | INTRAMUSCULAR | Status: AC
  Administered 2017-12-18: 0.5 mL via INTRAMUSCULAR
  Filled 2017-12-18: qty 0.5

## 2017-12-18 MED ORDER — KETOROLAC TROMETHAMINE 30 MG/ML IJ SOLN
30.0000 mg | Freq: Once | INTRAMUSCULAR | Status: AC
  Administered 2017-12-18: 30 mg via INTRAMUSCULAR
  Filled 2017-12-18: qty 1

## 2017-12-18 NOTE — Discharge Instructions (Addendum)
You were seen today after dog bite.  You will be at risk for infection.  Monitor for signs and symptoms of drainage or redness.  If you develop any of these you need to be reevaluated.  Take antibiotics as prescribed.  Keep iced and elevated.

## 2017-12-18 NOTE — ED Provider Notes (Signed)
[facility] EMERGENCY DEPARTMENT Provider Note   CSN: [id number] Arrival date & time: 12/18/17  0009     History   Chief Complaint Chief Complaint  Patient presents with  . Animal Bite    HPI [name] is a 62 y.o. female.  HPI  This is a 62 year old female with a history of Wolff-Parkinson-White, seizures who presents with a dog bite.  Patient reports that she was stepping over her dog when her dog bit her.  She is unsure whether she "spooked her" or accidentally stepped on her.  She reports that the dog is up-to-date on immunizations including rabies.  She is reporting 9 out of 10 pain to the right foot.  She reports numbness of all the toes.  She has been ambulatory.  She has not taken anything for her pain.  Unknown last tetanus shot.  Denies other injury.  Past Medical History:  Diagnosis Date  . Abnormal heart rhythm    WPW pattern w/delta waves oon EKG  . Anxiety   . GERD (gastroesophageal reflux disease)   . Osteoarthritis   . Osteoporosis   . Seizures [facility])     Patient Active Problem List   Diagnosis Date Noted  . Wolff-Parkinson-White (WPW) pattern seen on electrocardiography 10/20/2016  . PVC's (premature ventricular contractions) 10/20/2016  . Tachycardia 09/18/2013  . HTN (hypertension) 09/18/2013  . ADHD (attention deficit hyperactivity disorder) 09/12/2012  . Seasonal allergic rhinitis 06/04/2011  . Allergic rhinitis 06/04/2011  . Obstructive sleep apnea 05/06/2011  . GERD (gastroesophageal reflux disease) 05/06/2011  . Esophageal reflux 05/06/2011  . Personal history of malignant carcinoid tumor 02/08/2010  . Personal history of malignant neuroendocrine tumor 02/08/2010  . COLONIC POLYPS, HX OF 02/07/2010  . History of colonic polyps 02/07/2010  . IRRITABLE BOWEL SYNDROME 12/24/2008  . ABDOMINAL PAIN, RIGHT LOWER QUADRANT 12/23/2008  . SEIZURES, HX OF 12/23/2008    Past Surgical History:  Procedure Laterality Date  .  ABDOMINAL HYSTERECTOMY  1998  . COLONOSCOPY    . cyst removed     right wrist  . NASAL SINUS SURGERY    . NM MYOCAR PERF WALL MOTION  04/2010   bruce myoview - perfusion defects (r/t breast attenuation and/or diaphragmatic attenuation), no significant ischemia, EF 72%, ECG positive for ischemia (~25mm horizontal ST depression in V3-5 w/some in V2; low risk scan  . OOPHORECTOMY  2001 or 2003  . TRANSTHORACIC ECHOCARDIOGRAM  09/2009   EF=>55%, possible mild anterior mitral leaflet thickening, trace MR; mild TR; trace pulm valve regurg     OB History   None      Home Medications    Prior to Admission medications   Medication Sig Start Date End Date Taking? Authorizing Provider  ALPRAZolam Duanne Moron) 1 MG tablet Take 1 mg by mouth 2 (two) times daily.    [provider]  amoxicillin-clavulanate (AUGMENTIN) 875-125 MG tablet Take 1 tablet by mouth every 12 (twelve) hours. 12/18/17   Jayelle Page, Barbette Hair, MD  atenolol (TENORMIN) 25 MG tablet Take 25 mg by mouth daily.    [provider]  atenolol (TENORMIN) 25 MG tablet TAKE 1 TABLET BY MOUTH EVERY DAY. SCHEDULE APPOINTMENT FOR REFILLS 12/12/17   Croitoru, Mihai, MD  HYDROcodone-acetaminophen (NORCO/VICODIN) 5-325 MG tablet Take 1 tablet by mouth every 4 (four) hours as needed. 12/18/17   Lilith Solana, Barbette Hair, MD  lubiprostone (AMITIZA) 8 MCG capsule Amitiza 8 mcg po bid 07/06/17   Mauri Pole, MD  pantoprazole ([place])  40 MG tablet TAKE 1 TABLET (40 MG TOTAL) BY MOUTH 2 (TWO) TIMES DAILY BEFORE A MEAL. 09/14/17   Levin Erp, PA  PHENobarbital (LUMINAL) 64.8 MG tablet Take 194.4 mg by mouth at bedtime.  02/16/12   Ricard Dillon, MD    Family History Family History  Problem Relation Age of Onset  . Rheum arthritis Mother   . Heart Problems Mother        palpitations  . Skin cancer Mother        melanoma  . Diabetes Father   . Heart disease Father        also ruptured gallbladder  . Cancer Sister         unknown type-kind was shutting organs down  . Sleep apnea Sister   . Asthma Brother        bronchitis  . Diabetes type II Brother   . Colon cancer Neg Hx   . Esophageal cancer Neg Hx   . Liver cancer Neg Hx   . Pancreatic cancer Neg Hx   . Rectal cancer Neg Hx   . Stomach cancer Neg Hx     Social History Social History   Tobacco Use  . Smoking status: Former Smoker    Packs/day: 2.50    Years: 35.00    Pack years: 87.50    Types: Cigarettes    Last attempt to quit: 03/06/2008    Years since quitting: 9.7  . Smokeless tobacco: Never Used  Substance Use Topics  . Alcohol use: No  . Drug use: No     Allergies   Linzess [linaclotide]; Indomethacin; Sulfamethoxazole; and Sulfonamide derivatives   Review of Systems Review of Systems  Musculoskeletal:       Right foot pain and swelling  Skin: Positive for wound. Negative for color change.  Neurological: Positive for numbness. Negative for weakness.  All other systems reviewed and are negative.    Physical Exam Updated Vital Signs BP 121/74 (BP Location: Left Arm)   Pulse 73   Temp 97.6 F (36.4 C) (Oral)   Resp 20   SpO2 100%   Physical Exam  Constitutional: She is oriented to person, place, and time. She appears well-developed and well-nourished. No distress.  HENT:  Head: Normocephalic and atraumatic.  Neck: Neck supple.  Cardiovascular: Normal rate, regular rhythm and normal heart sounds.  Pulmonary/Chest: Effort normal and breath sounds normal. No respiratory distress. She has no wheezes.  Musculoskeletal:  Acute examination of the right foot with 2 superficial puncture wounds over the lateral aspect of the foot over the fourth and fifth metatarsals, slight swelling, no significant erythema, no drainage, sensation intact in all toes, limited flexion and extension secondary to pain  Neurological: She is alert and oriented to person, place, and time.  Skin: Skin is warm and dry.  Psychiatric: She has a normal  mood and affect.  Nursing note and vitals reviewed.    ED Treatments / Results  Labs (all labs ordered are listed, but only abnormal results are displayed) Labs Reviewed - No data to display  EKG None  Radiology Dg Foot Complete Right  Result Date: 12/18/2017 CLINICAL DATA:  Foot pain, dog bite EXAM: RIGHT FOOT COMPLETE - 3+ VIEW COMPARISON:  01/06/2010 FINDINGS: No fracture or malalignment. No radiopaque foreign body. No soft tissue emphysema. Small plantar calcaneal spur. IMPRESSION: No acute osseous abnormality. Electronically Signed   By: Donavan Foil M.D.   On: 12/18/2017 00:56    Procedures Procedures (including  critical care time)  Medications Ordered in ED Medications  oxyCODONE-acetaminophen (PERCOCET/ROXICET) 5-325 MG per tablet 1 tablet (1 tablet Oral Not Given 12/18/17 0419)  oxyCODONE-acetaminophen (PERCOCET/ROXICET) 5-325 MG per tablet 1 tablet (1 tablet Oral Given 12/18/17 0418)  amoxicillin-clavulanate (AUGMENTIN) 875-125 MG per tablet 1 tablet (1 tablet Oral Given 12/18/17 0418)  Tdap (BOOSTRIX) injection 0.5 mL (0.5 mLs Intramuscular Given 12/18/17 0420)  ketorolac (TORADOL) 30 MG/ML injection 30 mg (30 mg Intramuscular Given 12/18/17 0419)     Initial Impression / Assessment and Plan / ED Course  I have reviewed the triage vital signs and the nursing notes.  Pertinent labs & imaging results that were available during my care of the patient were reviewed by me and considered in my medical decision making (see chart for details).     Patient presents with a dog bite to the right foot.  Vaccinations up-to-date for the dog.  Patient was provided tetanus, pain control.  Wound was extensively irrigated and soaked.  On recheck after pain control, patient has good range of motion of all 5 digits with flexion and extension.  Doubt tendon injury.  I advised the patient that she is at high risk for infection.  Will discharge with Augmentin and Norco for pain control.   Keep elevated and ice.  If she notes any drainage or erythema she needs to be reevaluated immediately.  Patient stated understanding.  After history, exam, and medical workup I feel the patient has been appropriately medically screened and is safe for discharge home. Pertinent diagnoses were discussed with the patient. Patient was given return precautions.   Final Clinical Impressions(s) / ED Diagnoses   Final diagnoses:  Dog bite, initial encounter    ED Discharge Orders         Ordered    amoxicillin-clavulanate (AUGMENTIN) 875-125 MG tablet  Every 12 hours     12/18/17 0451    HYDROcodone-acetaminophen (NORCO/VICODIN) 5-325 MG tablet  Every 4 hours PRN     12/18/17 0451           Merryl Hacker, MD 12/18/17 [phone]

## 2017-12-18 NOTE — ED Triage Notes (Addendum)
Pt reports she was bit by her dog on her R foot. Pt has puncture wound to top of foot. Pt reports she used hydrogen peroxide to clean wound. Pt reports pain to foot and unable to move toes due to pain. Pt reports her dog is up to date on rabies vaccine

## 2018-01-04 ENCOUNTER — Other Ambulatory Visit: Payer: Self-pay | Admitting: Cardiovascular Disease

## 2018-02-05 ENCOUNTER — Other Ambulatory Visit: Payer: Self-pay | Admitting: Cardiovascular Disease

## 2018-03-27 ENCOUNTER — Other Ambulatory Visit: Payer: Self-pay | Admitting: Obstetrics and Gynecology

## 2018-03-27 DIAGNOSIS — N6310 Unspecified lump in the right breast, unspecified quadrant: Secondary | ICD-10-CM

## 2018-04-02 ENCOUNTER — Other Ambulatory Visit: Payer: Self-pay | Admitting: Cardiovascular Disease

## 2018-04-10 ENCOUNTER — Other Ambulatory Visit: Payer: Self-pay | Admitting: Cardiovascular Disease

## 2018-04-18 ENCOUNTER — Ambulatory Visit
Admission: RE | Admit: 2018-04-18 | Discharge: 2018-04-18 | Disposition: A | Discharge status: Home / Self Care | Payer: BC Managed Care – PPO | Source: Ambulatory Visit | Attending: Obstetrics and Gynecology | Admitting: Obstetrics and Gynecology

## 2018-04-18 DIAGNOSIS — N6310 Unspecified lump in the right breast, unspecified quadrant: Secondary | ICD-10-CM

## 2019-09-04 ENCOUNTER — Telehealth: Payer: Self-pay | Admitting: Gastroenterology

## 2019-09-04 NOTE — Telephone Encounter (Signed)
Sure

## 2019-10-08 ENCOUNTER — Ambulatory Visit: Payer: Self-pay | Admitting: General Surgery

## 2019-10-09 DIAGNOSIS — U071 COVID-19: Secondary | ICD-10-CM

## 2019-10-09 HISTORY — DX: COVID-19: U07.1

## 2019-10-20 ENCOUNTER — Emergency Department (HOSPITAL_COMMUNITY): Payer: Managed Care, Other (non HMO)

## 2019-10-20 ENCOUNTER — Emergency Department (HOSPITAL_COMMUNITY)
Admission: EM | Admit: 2019-10-20 | Discharge: 2019-10-21 | Disposition: A | Discharge status: Home / Self Care | Payer: Managed Care, Other (non HMO) | Attending: Emergency Medicine | Admitting: Emergency Medicine

## 2019-10-20 ENCOUNTER — Encounter (HOSPITAL_COMMUNITY): Payer: Self-pay | Admitting: Emergency Medicine

## 2019-10-20 DIAGNOSIS — I456 Pre-excitation syndrome: Secondary | ICD-10-CM | POA: Insufficient documentation

## 2019-10-20 DIAGNOSIS — Z87891 Personal history of nicotine dependence: Secondary | ICD-10-CM | POA: Insufficient documentation

## 2019-10-20 DIAGNOSIS — R110 Nausea: Secondary | ICD-10-CM | POA: Insufficient documentation

## 2019-10-20 DIAGNOSIS — R531 Weakness: Secondary | ICD-10-CM | POA: Diagnosis present

## 2019-10-20 DIAGNOSIS — I10 Essential (primary) hypertension: Secondary | ICD-10-CM | POA: Insufficient documentation

## 2019-10-20 DIAGNOSIS — U071 COVID-19: Secondary | ICD-10-CM | POA: Insufficient documentation

## 2019-10-20 DIAGNOSIS — Z79899 Other long term (current) drug therapy: Secondary | ICD-10-CM | POA: Diagnosis not present

## 2019-10-20 LAB — CBC WITH DIFFERENTIAL/PLATELET
Abs Immature Granulocytes: 0.03 10*3/uL (ref 0.00–0.07)
Basophils Absolute: 0 10*3/uL (ref 0.0–0.1)
Basophils Relative: 0 %
Eosinophils Absolute: 0.1 10*3/uL (ref 0.0–0.5)
Eosinophils Relative: 2 %
HCT: 36.3 % (ref 36.0–46.0)
Hemoglobin: 12.4 g/dL (ref 12.0–15.0)
Immature Granulocytes: 1 %
Lymphocytes Relative: 22 %
Lymphs Abs: 1.1 10*3/uL (ref 0.7–4.0)
MCH: 33.7 pg (ref 26.0–34.0)
MCHC: 34.2 g/dL (ref 30.0–36.0)
MCV: 98.6 fL (ref 80.0–100.0)
Monocytes Absolute: 0.5 10*3/uL (ref 0.1–1.0)
Monocytes Relative: 10 %
Neutro Abs: 3.4 10*3/uL (ref 1.7–7.7)
Neutrophils Relative %: 65 %
Platelets: 295 10*3/uL (ref 150–400)
RBC: 3.68 MIL/uL — ABNORMAL LOW (ref 3.87–5.11)
RDW: 11.9 % (ref 11.5–15.5)
WBC: 5.2 10*3/uL (ref 4.0–10.5)
nRBC: 0 % (ref 0.0–0.2)

## 2019-10-20 LAB — COMPREHENSIVE METABOLIC PANEL
ALT: 39 U/L (ref 0–44)
AST: 43 U/L — ABNORMAL HIGH (ref 15–41)
Albumin: 3 g/dL — ABNORMAL LOW (ref 3.5–5.0)
Alkaline Phosphatase: 75 U/L (ref 38–126)
Anion gap: 12 (ref 5–15)
BUN: 7 mg/dL — ABNORMAL LOW (ref 8–23)
CO2: 21 mmol/L — ABNORMAL LOW (ref 22–32)
Calcium: 8.6 mg/dL — ABNORMAL LOW (ref 8.9–10.3)
Chloride: 104 mmol/L (ref 98–111)
Creatinine, Ser: 0.65 mg/dL (ref 0.44–1.00)
GFR calc Af Amer: >60 mL/min (ref >60)
GFR calc non Af Amer: >60 mL/min (ref >60)
Glucose, Bld: 88 mg/dL (ref 70–99)
Potassium: 3.3 mmol/L — ABNORMAL LOW (ref 3.5–5.1)
Sodium: 137 mmol/L (ref 135–145)
Total Bilirubin: 0.8 mg/dL (ref 0.3–1.2)
Total Protein: 6.3 g/dL — ABNORMAL LOW (ref 6.5–8.1)

## 2019-10-20 LAB — LACTIC ACID, PLASMA: Lactic Acid, Venous: 1 mmol/L (ref 0.5–1.9)

## 2019-10-20 LAB — LACTATE DEHYDROGENASE: LDH: 233 U/L — ABNORMAL HIGH (ref 98–192)

## 2019-10-20 LAB — D-DIMER, QUANTITATIVE: D-Dimer, Quant: 0.86 ug/mL-FEU — ABNORMAL HIGH (ref 0.00–0.50)

## 2019-10-20 LAB — FIBRINOGEN: Fibrinogen: 709 mg/dL — ABNORMAL HIGH (ref 210–475)

## 2019-10-20 LAB — TRIGLYCERIDES: Triglycerides: 112 mg/dL (ref <150)

## 2019-10-20 MED ORDER — LACTATED RINGERS IV BOLUS
1000.0000 mL | Freq: Once | INTRAVENOUS | Status: AC
  Administered 2019-10-20: 1000 mL via INTRAVENOUS

## 2019-10-20 MED ORDER — ONDANSETRON HCL 4 MG/2ML IJ SOLN
4.0000 mg | Freq: Once | INTRAMUSCULAR | Status: AC
  Administered 2019-10-20: 4 mg via INTRAVENOUS
  Filled 2019-10-20: qty 2

## 2019-10-20 MED ORDER — ACETAMINOPHEN 325 MG PO TABS
650.0000 mg | ORAL_TABLET | Freq: Once | ORAL | Status: AC
  Administered 2019-10-20: 650 mg via ORAL
  Filled 2019-10-20: qty 2

## 2019-10-20 NOTE — ED Triage Notes (Addendum)
Pt is COVID + x 10 days, gradually has become weak, does not feel like she can hold her head up or pick up her arms. Pt is not vaccinated, her son was sent home from group home with COVID, husband is here in ED as well. VSS, pt has not had vaccination

## 2019-10-20 NOTE — ED Notes (Signed)
Please call son Elta Guadeloupe @ [phone] a status--[name]

## 2019-10-20 NOTE — ED Provider Notes (Signed)
[facility] EMERGENCY DEPARTMENT Provider Note   CSN: [id number] Arrival date & time: 10/20/19  2016     History Chief Complaint  Patient presents with  . COVID    [name] is a 64 y.o. female.  The history is provided by the patient and medical records.    64 year old female with history of anxiety, seizure disorder, osteoporosis, osteoarthritis, presenting to the ED for generalized weakness.  States she tested positive for COVID-19 on 10/09/2019 after 24 hours of symptoms (positive test visible in Care Everywhere).  Her adult son was reportedly sent home from group home due to COVID-19 infection.  Her husband is also Covid positive.  States initially they were doing okay but over the past several days she has become increasingly more weak.  This is a generalized weakness.  States she has had a lot of cough with white sputum and has started to have a lot of nausea and vomiting prohibiting her from eating and drinking regularly.  She has not had any vomiting or diarrhea.  She continues to have intermittent fevers.  States today it got to the point where she felt like she could not even get out of bed or hold her head up because she is so weak.  She has been on antibiotics prescribed from her PCP without any change in symptoms.  She is not vaccinated against covid 19.  Past Medical History:  Diagnosis Date  . Abnormal heart rhythm    WPW pattern w/delta waves oon EKG  . Anxiety   . COVID-19   . GERD (gastroesophageal reflux disease)   . Osteoarthritis   . Osteoporosis   . Seizures [facility])     Patient Active Problem List   Diagnosis Date Noted  . Wolff-Parkinson-White (WPW) pattern seen on electrocardiography 10/20/2016  . PVC's (premature ventricular contractions) 10/20/2016  . Tachycardia 09/18/2013  . HTN (hypertension) 09/18/2013  . ADHD (attention deficit hyperactivity disorder) 09/12/2012  . Seasonal allergic rhinitis 06/04/2011  . Allergic rhinitis  06/04/2011  . Obstructive sleep apnea 05/06/2011  . GERD (gastroesophageal reflux disease) 05/06/2011  . Esophageal reflux 05/06/2011  . Personal history of malignant carcinoid tumor 02/08/2010  . Personal history of malignant neuroendocrine tumor 02/08/2010  . COLONIC POLYPS, HX OF 02/07/2010  . History of colonic polyps 02/07/2010  . IRRITABLE BOWEL SYNDROME 12/24/2008  . ABDOMINAL PAIN, RIGHT LOWER QUADRANT 12/23/2008  . SEIZURES, HX OF 12/23/2008    Past Surgical History:  Procedure Laterality Date  . ABDOMINAL HYSTERECTOMY  1998  . COLONOSCOPY    . cyst removed     right wrist  . NASAL SINUS SURGERY    . NM MYOCAR PERF WALL MOTION  04/2010   bruce myoview - perfusion defects (r/t breast attenuation and/or diaphragmatic attenuation), no significant ischemia, EF 72%, ECG positive for ischemia (~2mm horizontal ST depression in V3-5 w/some in V2; low risk scan  . OOPHORECTOMY  2001 or 2003  . TRANSTHORACIC ECHOCARDIOGRAM  09/2009   EF=>55%, possible mild anterior mitral leaflet thickening, trace MR; mild TR; trace pulm valve regurg     OB History   No obstetric history on file.     Family History  Problem Relation Age of Onset  . Rheum arthritis Mother   . Heart Problems Mother        palpitations  . Skin cancer Mother        melanoma  . Diabetes Father   . Heart disease Father  also ruptured gallbladder  . Cancer Sister        unknown type-kind was shutting organs down  . Sleep apnea Sister   . Asthma Brother        bronchitis  . Diabetes type II Brother   . Colon cancer Neg Hx   . Esophageal cancer Neg Hx   . Liver cancer Neg Hx   . Pancreatic cancer Neg Hx   . Rectal cancer Neg Hx   . Stomach cancer Neg Hx     Social History   Tobacco Use  . Smoking status: Former Smoker    Packs/day: 2.50    Years: 35.00    Pack years: 87.50    Types: Cigarettes    Quit date: 03/06/2008    Years since quitting: 11.6  . Smokeless tobacco: Never Used  Vaping Use   . Vaping Use: Never used  Substance Use Topics  . Alcohol use: No  . Drug use: No    Home Medications Prior to Admission medications   Medication Sig Start Date End Date Taking? Authorizing Provider  ALPRAZolam Duanne Moron) 1 MG tablet Take 1 mg by mouth 2 (two) times daily.    [provider]  amoxicillin-clavulanate (AUGMENTIN) 875-125 MG tablet Take 1 tablet by mouth every 12 (twelve) hours. 12/18/17   Horton, Barbette Hair, MD  atenolol (TENORMIN) 25 MG tablet Take 1 tablet (25 mg total) by mouth daily. NEED OV. 04/10/18   Croitoru, Mihai, MD  HYDROcodone-acetaminophen (NORCO/VICODIN) 5-325 MG tablet Take 1 tablet by mouth every 4 (four) hours as needed. 12/18/17   Horton, Barbette Hair, MD  lubiprostone (AMITIZA) 8 MCG capsule Amitiza 8 mcg po bid 07/06/17   Nandigam, Venia Minks, MD  pantoprazole (PROTONIX) 40 MG tablet TAKE 1 TABLET (40 MG TOTAL) BY MOUTH 2 (TWO) TIMES DAILY BEFORE A MEAL. 09/14/17   Levin Erp, PA  PHENobarbital (LUMINAL) 64.8 MG tablet Take 194.4 mg by mouth at bedtime.  02/16/12   Ricard Dillon, MD    Allergies    Linzess [linaclotide], Indomethacin, Sulfamethoxazole, and Sulfonamide derivatives  Review of Systems   Review of Systems  Constitutional: Positive for appetite change, fatigue and fever.  Respiratory: Positive for cough.   Neurological: Positive for weakness (Generalized).  All other systems reviewed and are negative.   Physical Exam Updated Vital Signs BP 120/63   Pulse 87   Temp 98.2 F (36.8 C) (Oral)   Resp 16   Ht 5\' 5"  (1.651 m)   Wt 70 kg   SpO2 97%   BMI 25.68 kg/m   Physical Exam Vitals and nursing note reviewed.  Constitutional:      Appearance: She is well-developed.     Comments: Appears to be feeling poorly, appears weak Non-toxic  HENT:     Head: Normocephalic and atraumatic.  Eyes:     Conjunctiva/sclera: Conjunctivae normal.     Pupils: Pupils are equal, round, and reactive to light.  Cardiovascular:      Rate and Rhythm: Normal rate and regular rhythm.     Heart sounds: Normal heart sounds.  Pulmonary:     Effort: Pulmonary effort is normal. No respiratory distress.     Breath sounds: Normal breath sounds. No rhonchi.  Abdominal:     General: Bowel sounds are normal.     Palpations: Abdomen is soft.     Tenderness: There is no abdominal tenderness. There is no rebound.  Musculoskeletal:        General: Normal range  of motion.     Cervical back: Normal range of motion.  Skin:    General: Skin is warm and dry.  Neurological:     Mental Status: She is alert and oriented to person, place, and time.     Comments: Awake, alert, answering questions and following commands appropriately, no focal deficits noted     ED Results / Procedures / Treatments   Labs (all labs ordered are listed, but only abnormal results are displayed) Labs Reviewed  CBC WITH DIFFERENTIAL/PLATELET - Abnormal; Notable for the following components:      Result Value   RBC 3.68 (*)    All other components within normal limits  COMPREHENSIVE METABOLIC PANEL - Abnormal; Notable for the following components:   Potassium 3.3 (*)    CO2 21 (*)    BUN 7 (*)    Calcium 8.6 (*)    Total Protein 6.3 (*)    Albumin 3.0 (*)    AST 43 (*)    All other components within normal limits  D-DIMER, QUANTITATIVE (NOT AT [facility]) - Abnormal; Notable for the following components:   D-Dimer, Quant 0.86 (*)    All other components within normal limits  LACTATE DEHYDROGENASE - Abnormal; Notable for the following components:   LDH 233 (*)    All other components within normal limits  FERRITIN - Abnormal; Notable for the following components:   Ferritin 878 (*)    All other components within normal limits  FIBRINOGEN - Abnormal; Notable for the following components:   Fibrinogen 709 (*)    All other components within normal limits  C-REACTIVE PROTEIN - Abnormal; Notable for the following components:   CRP 19.9 (*)    All other  components within normal limits  CULTURE, BLOOD (ROUTINE X 2)  CULTURE, BLOOD (ROUTINE X 2)  LACTIC ACID, PLASMA  PROCALCITONIN  TRIGLYCERIDES    EKG EKG Interpretation  Date/Time:  Monday October 20 2019 22:32:12 EDT Ventricular Rate:  91 PR Interval:    QRS Duration: 109 QT Interval:  412 QTC Calculation: 507 R Axis:   -43 Text Interpretation: Sinus rhythm Short PR interval Consider RVH w/ secondary repol abnormality LVH with secondary repolarization abnormality Inferior infarct, old Prolonged QT interval No significant change since last tracing Confirmed by Calvert Cantor [phone]) on 10/20/2019 10:34:36 PM   Radiology DG Chest Port 1 View  Result Date: 10/20/2019 CLINICAL DATA:  COVID positive with weakness. EXAM: PORTABLE CHEST 1 VIEW COMPARISON:  October 01, 2015 FINDINGS: Mild, ill-defined multifocal infiltrates are seen along the periphery of both lungs. There is no evidence of a pleural effusion or pneumothorax. The heart size and mediastinal contours are within normal limits. Chronic changes seen involving the right humeral head. The visualized skeletal structures are otherwise unremarkable. IMPRESSION: Mild bilateral multifocal infiltrates. Electronically Signed   By: Virgina Norfolk M.D.   On: 10/20/2019 23:11    Procedures Procedures (including critical care time)  Medications Ordered in ED Medications  acetaminophen (TYLENOL) tablet 650 mg (650 mg Oral Given 10/20/19 2226)  ondansetron (ZOFRAN) injection 4 mg (4 mg Intravenous Given 10/20/19 2359)  lactated ringers bolus 1,000 mL (0 mLs Intravenous Stopped 10/21/19 0112)    ED Course  I have reviewed the triage vital signs and the nursing notes.  Pertinent labs & imaging results that were available during my care of the patient were reviewed by me and considered in my medical decision making (see chart for details).    MDM Rules/Calculators/A&P  64 year old  female presenting to the ED for generalized weakness.   Diagnosed COVID-19 positive on 10/09/2019 (positive test result in care everywhere).  Symptoms have been worsening since that time.  She is afebrile and nontoxic in appearance here.  Vitals are stable without any noted tachycardia or hypoxia.  Labs are grossly reassuring with a normal white count, normal lactate, no significant electrolyte derangement.  Her inflammatory markers are elevated.  CXR with mild multifocal infiltrates.  Patient has remained hemodynamically stable here in the ED, continued maintaining good oxygen saturation on RA without respiratory distress.  At this time, she is not meeting criteria for admission.  She has had symptoms 10+ days so not a candidate for MAB either.  Will discharge home with continued symptomatic control, Rx zofran.  Encouraged good oral hydration and rest.  Close follow-up with PCP.  Return here for any new/acute changes.  Final Clinical Impression(s) / ED Diagnoses Final diagnoses:  COVID-19  Nausea    Rx / DC Orders ED Discharge Orders         Ordered    ondansetron (ZOFRAN ODT) 4 MG disintegrating tablet  Every 8 hours PRN     Discontinue  Reprint     10/21/19 0049           Larene Pickett, PA-C 10/21/19 0140    Ripley Fraise, MD 10/21/19 0221

## 2019-10-21 LAB — PROCALCITONIN: Procalcitonin: 4.78 ng/mL

## 2019-10-21 LAB — FERRITIN: Ferritin: 878 ng/mL — ABNORMAL HIGH (ref 11–307)

## 2019-10-21 LAB — C-REACTIVE PROTEIN: CRP: 19.9 mg/dL — ABNORMAL HIGH (ref <1.0)

## 2019-10-21 MED ORDER — ONDANSETRON 4 MG PO TBDP
4.0000 mg | ORAL_TABLET | Freq: Three times a day (TID) | ORAL | 0 refills | Status: DC | PRN
Start: 2019-10-21 — End: 2019-12-15

## 2019-10-21 NOTE — Discharge Instructions (Addendum)
Your labs today were all reassuring. Make sure to rest and drink fluids.  Can use zofran if needed for nausea-- sent to pharmacy for you. Follow-up with your primary care doctor. Return here for new concerns.

## 2019-10-21 NOTE — ED Provider Notes (Signed)
Patient seen/examined in the Emergency Department in conjunction with Advanced Practice Provider [place] Patient reports she is COVID positive and feels fatigued, and has HA with nausea Exam : appears fatigued, but in no distress, no hypoxia, nontoxic in appearance Plan: will give fluids/zofran but anticipate discharge home     Ripley Fraise, MD 10/21/19 0005

## 2019-10-21 NOTE — ED Notes (Signed)
Please call daughter Larene Beach @ [phone]--FSFSE to make sure she gets her  Epilepsy meds--Phenobarbital--Errika Narvaiz

## 2019-10-22 LAB — BLOOD CULTURE ID PANEL (REFLEXED) - BCID2

## 2019-10-24 LAB — CULTURE, BLOOD (ROUTINE X 2): Special Requests: ADEQUATE

## 2019-10-26 LAB — CULTURE, BLOOD (ROUTINE X 2)
Culture: NO GROWTH
Special Requests: ADEQUATE

## 2019-11-11 ENCOUNTER — Other Ambulatory Visit (HOSPITAL_COMMUNITY): Payer: Managed Care, Other (non HMO)

## 2019-12-15 ENCOUNTER — Other Ambulatory Visit (HOSPITAL_COMMUNITY): Payer: Managed Care, Other (non HMO)

## 2019-12-15 ENCOUNTER — Encounter (HOSPITAL_BASED_OUTPATIENT_CLINIC_OR_DEPARTMENT_OTHER): Payer: Self-pay | Admitting: General Surgery

## 2019-12-15 ENCOUNTER — Other Ambulatory Visit: Payer: Self-pay

## 2019-12-15 NOTE — Progress Notes (Addendum)
Addendum: spoke with jessica zanetto pa meets [facility] guidelines   Spoke w/ via phone for pre-op interview---pt Lab needs dos----    bmet           COVID test ------positive 10-09-2019 covid test requested from [facility] phone [phone] left voice mail message , received positive covid 10-09-2019 [facility] covid test results 10-09-2019 and placed on chart Arrive at -------530 am 12-18-2019 NPO after MN NO Solid Food.  Clear liquids from MN until---430 am then npo Medications to take morning of surgery -----none Diabetic medication -----n/a Patient Special Instructions -----none Pre-Op special Istructions -----none Patient verbalized understanding of instructions that were given at this phone interview. Patient denies shortness of breath, chest pain, fever, cough at this phone interview.  Anesthesia : last saw dr Sallyanne Kuster 10-20-2016 for abnormal ekg note says follow up iin 12 months pt did not feel need to go back since her stress was gone, hx of seizures, last seizure 2018 due to stress  Chart to jessica zanetto pa for review  PCP: Kathryne Hitch np  Cardiologist : dr croitoru lov 10-20-2016 note say follow up in 12 months Chest x-ray :none EKG :10-20-2019 Echo : 09/24/2013 e[pic Stress test: 04-2010 epic Cardiac Cath : none Activity level: nomrla can climb steps and does housework Sleep Study/ CPAP :none Sleep study 05-19-2011 epic and 05-10-2011 epic Fasting Blood Sugar :      / Checks Blood Sugar -- times a day:  n/a Blood Thinner/ Instructions /Last Dose:n/a ASA / Instructions/ Last Dose : n/a

## 2019-12-17 NOTE — Anesthesia Preprocedure Evaluation (Addendum)
Anesthesia Evaluation  Patient identified by MRN, date of birth, ID band Patient awake    Reviewed: Allergy & Precautions, NPO status , Patient's Chart, lab work & pertinent test results  Airway Mallampati: II  TM Distance: >3 FB Neck ROM: Full    Dental  (+) Dental Advisory Given   Pulmonary sleep apnea , former smoker,    breath sounds clear to auscultation       Cardiovascular negative cardio ROS   Rhythm:Regular Rate:Normal  WPW   Neuro/Psych Seizures -,     GI/Hepatic Neg liver ROS, GERD  ,  Endo/Other  negative endocrine ROS  Renal/GU negative Renal ROS     Musculoskeletal  (+) Arthritis ,   Abdominal   Peds  Hematology negative hematology ROS (+)   Anesthesia Other Findings   Reproductive/Obstetrics                            Anesthesia Physical Anesthesia Plan  ASA: II  Anesthesia Plan: General   Post-op Pain Management:    Induction: Intravenous  PONV Risk Score and Plan: 3 and Dexamethasone, Ondansetron, Treatment may vary due to age or medical condition and Midazolam  Airway Management Planned: Oral ETT  Additional Equipment: None  Intra-op Plan:   Post-operative Plan: Extubation in OR  Informed Consent: I have reviewed the patients History and Physical, chart, labs and discussed the procedure including the risks, benefits and alternatives for the proposed anesthesia with the patient or authorized representative who has indicated his/her understanding and acceptance.     Dental advisory given  Plan Discussed with: CRNA  Anesthesia Plan Comments:        Anesthesia Quick Evaluation

## 2019-12-18 ENCOUNTER — Ambulatory Visit (HOSPITAL_BASED_OUTPATIENT_CLINIC_OR_DEPARTMENT_OTHER): Payer: Managed Care, Other (non HMO) | Admitting: Physician Assistant

## 2019-12-18 ENCOUNTER — Ambulatory Visit (HOSPITAL_BASED_OUTPATIENT_CLINIC_OR_DEPARTMENT_OTHER)
Admission: RE | Admit: 2019-12-18 | Discharge: 2019-12-18 | Disposition: A | Discharge status: Home / Self Care | Payer: Managed Care, Other (non HMO) | Attending: General Surgery | Admitting: General Surgery

## 2019-12-18 ENCOUNTER — Encounter (HOSPITAL_BASED_OUTPATIENT_CLINIC_OR_DEPARTMENT_OTHER): Payer: Self-pay | Admitting: General Surgery

## 2019-12-18 ENCOUNTER — Encounter (HOSPITAL_BASED_OUTPATIENT_CLINIC_OR_DEPARTMENT_OTHER)
Admission: RE | Disposition: A | Discharge status: Home / Self Care | Payer: Self-pay | Source: Home / Self Care | Attending: General Surgery

## 2019-12-18 ENCOUNTER — Other Ambulatory Visit: Payer: Self-pay

## 2019-12-18 DIAGNOSIS — F419 Anxiety disorder, unspecified: Secondary | ICD-10-CM | POA: Insufficient documentation

## 2019-12-18 DIAGNOSIS — K811 Chronic cholecystitis: Secondary | ICD-10-CM | POA: Diagnosis present

## 2019-12-18 DIAGNOSIS — K8010 Calculus of gallbladder with chronic cholecystitis without obstruction: Secondary | ICD-10-CM | POA: Diagnosis not present

## 2019-12-18 DIAGNOSIS — K660 Peritoneal adhesions (postprocedural) (postinfection): Secondary | ICD-10-CM | POA: Insufficient documentation

## 2019-12-18 DIAGNOSIS — Z8616 Personal history of COVID-19: Secondary | ICD-10-CM | POA: Diagnosis not present

## 2019-12-18 DIAGNOSIS — Z79899 Other long term (current) drug therapy: Secondary | ICD-10-CM | POA: Diagnosis not present

## 2019-12-18 DIAGNOSIS — G4730 Sleep apnea, unspecified: Secondary | ICD-10-CM | POA: Diagnosis not present

## 2019-12-18 DIAGNOSIS — Z882 Allergy status to sulfonamides status: Secondary | ICD-10-CM | POA: Diagnosis not present

## 2019-12-18 DIAGNOSIS — Z87891 Personal history of nicotine dependence: Secondary | ICD-10-CM | POA: Insufficient documentation

## 2019-12-18 DIAGNOSIS — Z8261 Family history of arthritis: Secondary | ICD-10-CM | POA: Insufficient documentation

## 2019-12-18 DIAGNOSIS — M1990 Unspecified osteoarthritis, unspecified site: Secondary | ICD-10-CM | POA: Insufficient documentation

## 2019-12-18 DIAGNOSIS — Z888 Allergy status to other drugs, medicaments and biological substances status: Secondary | ICD-10-CM | POA: Diagnosis not present

## 2019-12-18 HISTORY — PX: CHOLECYSTECTOMY: SHX55

## 2019-12-18 LAB — POCT I-STAT, CHEM 8
BUN: 13 mg/dL (ref 8–23)
Calcium, Ion: 1.24 mmol/L (ref 1.15–1.40)
Chloride: 104 mmol/L (ref 98–111)
Creatinine, Ser: 0.7 mg/dL (ref 0.44–1.00)
Glucose, Bld: 92 mg/dL (ref 70–99)
HCT: 39 % (ref 36.0–46.0)
Hemoglobin: 13.3 g/dL (ref 12.0–15.0)
Potassium: 4.2 mmol/L (ref 3.5–5.1)
Sodium: 140 mmol/L (ref 135–145)
TCO2: 26 mmol/L (ref 22–32)

## 2019-12-18 SURGERY — LAPAROSCOPIC CHOLECYSTECTOMY
Anesthesia: General | Site: Abdomen

## 2019-12-18 MED ORDER — IBUPROFEN 800 MG PO TABS: 800.0000 mg | ORAL_TABLET | Freq: Three times a day (TID) | ORAL | 0 refills | Status: AC | PRN

## 2019-12-18 MED ORDER — AMISULPRIDE (ANTIEMETIC) 5 MG/2ML IV SOLN: 10.0000 mg | Freq: Once | INTRAVENOUS | Status: DC | PRN

## 2019-12-18 MED ORDER — ACETAMINOPHEN 500 MG PO TABS
ORAL_TABLET | ORAL | Status: AC
  Filled 2019-12-18: qty 2

## 2019-12-18 MED ORDER — ACETAMINOPHEN 500 MG PO TABS
1000.0000 mg | ORAL_TABLET | ORAL | Status: AC
  Administered 2019-12-18: 1000 mg via ORAL

## 2019-12-18 MED ORDER — DEXAMETHASONE SODIUM PHOSPHATE 10 MG/ML IJ SOLN
INTRAMUSCULAR | Status: AC
  Filled 2019-12-18: qty 1

## 2019-12-18 MED ORDER — GABAPENTIN 300 MG PO CAPS
ORAL_CAPSULE | ORAL | Status: AC
  Filled 2019-12-18: qty 1

## 2019-12-18 MED ORDER — OXYCODONE HCL 5 MG PO TABS: 5.0000 mg | ORAL_TABLET | Freq: Four times a day (QID) | ORAL | 0 refills | Status: AC | PRN

## 2019-12-18 MED ORDER — CEFAZOLIN SODIUM-DEXTROSE 2-4 GM/100ML-% IV SOLN
INTRAVENOUS | Status: AC
  Filled 2019-12-18: qty 100

## 2019-12-18 MED ORDER — LIDOCAINE 2% (20 MG/ML) 5 ML SYRINGE
INTRAMUSCULAR | Status: AC
  Filled 2019-12-18: qty 5

## 2019-12-18 MED ORDER — FENTANYL CITRATE (PF) 100 MCG/2ML IJ SOLN
INTRAMUSCULAR | Status: AC
  Filled 2019-12-18: qty 2

## 2019-12-18 MED ORDER — GABAPENTIN 300 MG PO CAPS
300.0000 mg | ORAL_CAPSULE | ORAL | Status: AC
  Administered 2019-12-18: 300 mg via ORAL

## 2019-12-18 MED ORDER — ROCURONIUM BROMIDE 10 MG/ML (PF) SYRINGE
PREFILLED_SYRINGE | INTRAVENOUS | Status: DC | PRN
  Administered 2019-12-18: 20 mg via INTRAVENOUS
  Administered 2019-12-18: 40 mg via INTRAVENOUS
  Administered 2019-12-18: 10 mg via INTRAVENOUS

## 2019-12-18 MED ORDER — OXYCODONE HCL 5 MG PO TABS
ORAL_TABLET | ORAL | Status: AC
  Filled 2019-12-18: qty 1

## 2019-12-18 MED ORDER — ENSURE PRE-SURGERY PO LIQD: 296.0000 mL | Freq: Once | ORAL | Status: DC

## 2019-12-18 MED ORDER — SODIUM CHLORIDE 0.9 % IR SOLN
Status: DC | PRN
  Administered 2019-12-18: 200 mL

## 2019-12-18 MED ORDER — OXYCODONE HCL 5 MG/5ML PO SOLN: 5.0000 mg | Freq: Once | ORAL | Status: AC | PRN

## 2019-12-18 MED ORDER — FENTANYL CITRATE (PF) 250 MCG/5ML IJ SOLN
INTRAMUSCULAR | Status: AC
Start: 2019-12-18 — End: ?
  Filled 2019-12-18: qty 5

## 2019-12-18 MED ORDER — ONDANSETRON HCL 4 MG/2ML IJ SOLN
INTRAMUSCULAR | Status: DC | PRN
  Administered 2019-12-18: 4 mg via INTRAVENOUS

## 2019-12-18 MED ORDER — FENTANYL CITRATE (PF) 100 MCG/2ML IJ SOLN
25.0000 ug | INTRAMUSCULAR | Status: DC | PRN
  Administered 2019-12-18: 25 ug via INTRAVENOUS

## 2019-12-18 MED ORDER — CHLORHEXIDINE GLUCONATE CLOTH 2 % EX PADS: 6.0000 | MEDICATED_PAD | Freq: Once | CUTANEOUS | Status: DC

## 2019-12-18 MED ORDER — PROPOFOL 10 MG/ML IV BOLUS
INTRAVENOUS | Status: AC
  Filled 2019-12-18: qty 20

## 2019-12-18 MED ORDER — DEXAMETHASONE SODIUM PHOSPHATE 10 MG/ML IJ SOLN
INTRAMUSCULAR | Status: DC | PRN
  Administered 2019-12-18: 5 mg via INTRAVENOUS

## 2019-12-18 MED ORDER — MIDAZOLAM HCL 5 MG/5ML IJ SOLN
INTRAMUSCULAR | Status: DC | PRN
  Administered 2019-12-18: 2 mg via INTRAVENOUS

## 2019-12-18 MED ORDER — FENTANYL CITRATE (PF) 250 MCG/5ML IJ SOLN
INTRAMUSCULAR | Status: DC | PRN
Start: 2019-12-18 — End: 2019-12-18
  Administered 2019-12-18 (×2): 100 ug via INTRAVENOUS

## 2019-12-18 MED ORDER — LACTATED RINGERS IV SOLN: INTRAVENOUS | Status: DC

## 2019-12-18 MED ORDER — ONDANSETRON HCL 4 MG/2ML IJ SOLN
INTRAMUSCULAR | Status: AC
  Filled 2019-12-18: qty 2

## 2019-12-18 MED ORDER — LIDOCAINE 2% (20 MG/ML) 5 ML SYRINGE
INTRAMUSCULAR | Status: DC | PRN
  Administered 2019-12-18: 60 mg via INTRAVENOUS

## 2019-12-18 MED ORDER — OXYCODONE HCL 5 MG PO TABS
5.0000 mg | ORAL_TABLET | Freq: Once | ORAL | Status: AC | PRN
  Administered 2019-12-18: 5 mg via ORAL

## 2019-12-18 MED ORDER — MIDAZOLAM HCL 2 MG/2ML IJ SOLN
INTRAMUSCULAR | Status: AC
  Filled 2019-12-18: qty 2

## 2019-12-18 MED ORDER — KETOROLAC TROMETHAMINE 30 MG/ML IJ SOLN
INTRAMUSCULAR | Status: AC
  Filled 2019-12-18: qty 1

## 2019-12-18 MED ORDER — SUGAMMADEX SODIUM 200 MG/2ML IV SOLN
INTRAVENOUS | Status: DC | PRN
  Administered 2019-12-18: 150 mg via INTRAVENOUS

## 2019-12-18 MED ORDER — KETOROLAC TROMETHAMINE 30 MG/ML IJ SOLN
30.0000 mg | Freq: Once | INTRAMUSCULAR | Status: AC | PRN
  Administered 2019-12-18: 30 mg via INTRAVENOUS

## 2019-12-18 MED ORDER — CEFAZOLIN SODIUM-DEXTROSE 2-4 GM/100ML-% IV SOLN
2.0000 g | INTRAVENOUS | Status: AC
  Administered 2019-12-18: 2 g via INTRAVENOUS

## 2019-12-18 MED ORDER — PROPOFOL 10 MG/ML IV BOLUS
INTRAVENOUS | Status: DC | PRN
  Administered 2019-12-18: 130 mg via INTRAVENOUS

## 2019-12-18 MED ORDER — BUPIVACAINE HCL 0.5 % IJ SOLN
INTRAMUSCULAR | Status: DC | PRN
  Administered 2019-12-18: 30 mL

## 2019-12-18 SURGICAL SUPPLY — 63 items
ADH SKN CLS APL DERMABOND .7 (GAUZE/BANDAGES/DRESSINGS) ×1
APL PRP STRL LF DISP 70% ISPRP (MISCELLANEOUS) ×1
APL SKNCLS STERI-STRIP NONHPOA (GAUZE/BANDAGES/DRESSINGS)
APPLIER CLIP ROT 10 11.4 M/L (STAPLE)
APR CLP MED LRG 11.4X10 (STAPLE)
BAG SPEC RTRVL 10 TROC 200 (ENDOMECHANICALS) ×1
BENZOIN TINCTURE PRP APPL 2/3 (GAUZE/BANDAGES/DRESSINGS) IMPLANT
BNDG ADH 1X3 SHEER STRL LF (GAUZE/BANDAGES/DRESSINGS) ×12 IMPLANT
BNDG ADH THN 3X1 STRL LF (GAUZE/BANDAGES/DRESSINGS) ×4
CABLE HIGH FREQUENCY MONO STRZ (ELECTRODE) ×3 IMPLANT
CATH CHOLANG 76X19 KUMAR (CATHETERS) IMPLANT
CHLORAPREP W/TINT 26 (MISCELLANEOUS) ×3 IMPLANT
CLIP APPLIE ROT 10 11.4 M/L (STAPLE) IMPLANT
CLIP VESOLOCK LG 6/CT PURPLE (CLIP) IMPLANT
CLIP VESOLOCK MED LG 6/CT (CLIP) ×5 IMPLANT
CLOSURE WOUND 1/2 X4 (GAUZE/BANDAGES/DRESSINGS)
COVER MAYO STAND STRL (DRAPES) ×3 IMPLANT
COVER WAND RF STERILE (DRAPES) ×3 IMPLANT
DECANTER SPIKE VIAL GLASS SM (MISCELLANEOUS) ×3 IMPLANT
DERMABOND ADVANCED (GAUZE/BANDAGES/DRESSINGS) ×2
DERMABOND ADVANCED .7 DNX12 (GAUZE/BANDAGES/DRESSINGS) ×1 IMPLANT
DRAIN CHANNEL 19F RND (DRAIN) IMPLANT
DRAPE C-ARM 42X120 X-RAY (DRAPES) ×1 IMPLANT
ELECT REM PT RETURN 9FT ADLT (ELECTROSURGICAL) ×3
ELECTRODE REM PT RTRN 9FT ADLT (ELECTROSURGICAL) ×1 IMPLANT
EVACUATOR SILICONE 100CC (DRAIN) IMPLANT
GLOVE BIO SURGEON STRL SZ 6 (GLOVE) ×2 IMPLANT
GLOVE BIO SURGEON STRL SZ 6.5 (GLOVE) ×2 IMPLANT
GLOVE BIO SURGEON STRL SZ7 (GLOVE) ×2 IMPLANT
GLOVE BIO SURGEONS STRL SZ 6.5 (GLOVE) ×2
GLOVE BIOGEL PI IND STRL 6.5 (GLOVE) IMPLANT
GLOVE BIOGEL PI IND STRL 7.0 (GLOVE) ×1 IMPLANT
GLOVE BIOGEL PI IND STRL 7.5 (GLOVE) IMPLANT
GLOVE BIOGEL PI INDICATOR 6.5 (GLOVE) ×2
GLOVE BIOGEL PI INDICATOR 7.0 (GLOVE) ×8
GLOVE BIOGEL PI INDICATOR 7.5 (GLOVE) ×2
GLOVE SURG SS PI 7.0 STRL IVOR (GLOVE) ×3 IMPLANT
GOWN STRL REUS W/TWL LRG LVL3 (GOWN DISPOSABLE) ×11 IMPLANT
GRASPER SUT TROCAR 14GX15 (MISCELLANEOUS) ×2 IMPLANT
HEMOSTAT SNOW SURGICEL 2X4 (HEMOSTASIS) IMPLANT
IRRIG SUCT STRYKERFLOW 2 WTIP (MISCELLANEOUS) ×3
IRRIGATION SUCT STRKRFLW 2 WTP (MISCELLANEOUS) ×1 IMPLANT
IV CATH 14GX2 1/4 (CATHETERS) IMPLANT
KIT TURNOVER CYSTO (KITS) ×3 IMPLANT
NS IRRIG 500ML POUR BTL (IV SOLUTION) ×3 IMPLANT
PACK BASIN DAY SURGERY FS (CUSTOM PROCEDURE TRAY) ×3 IMPLANT
PENCIL SMOKE EVACUATOR (MISCELLANEOUS) IMPLANT
POUCH RETRIEVAL ECOSAC 10 (ENDOMECHANICALS) ×1 IMPLANT
POUCH RETRIEVAL ECOSAC 10MM (ENDOMECHANICALS) ×3
SCISSORS LAP 5X35 DISP (ENDOMECHANICALS) ×3 IMPLANT
SET TUBE SMOKE EVAC HIGH FLOW (TUBING) ×3 IMPLANT
STOPCOCK 4 WAY LG BORE MALE ST (IV SETS) IMPLANT
STRIP CLOSURE SKIN 1/2X4 (GAUZE/BANDAGES/DRESSINGS) IMPLANT
SUT ETHILON 2 0 PS N (SUTURE) IMPLANT
SUT MNCRL AB 4-0 PS2 18 (SUTURE) ×3 IMPLANT
SUT VICRYL 0 ENDOLOOP (SUTURE) IMPLANT
SUT VICRYL 0 UR6 27IN ABS (SUTURE) ×2 IMPLANT
TOWEL OR 17X26 10 PK STRL BLUE (TOWEL DISPOSABLE) ×3 IMPLANT
TRAY LAPAROSCOPIC (CUSTOM PROCEDURE TRAY) ×3 IMPLANT
TROCAR BLADELESS OPT 12M 100M (ENDOMECHANICALS) IMPLANT
TROCAR BLADELESS OPT 5 100 (ENDOMECHANICALS) ×9 IMPLANT
TROCAR XCEL NON-BLD 11X100MML (ENDOMECHANICALS) ×3 IMPLANT
WARMER LAPAROSCOPE (MISCELLANEOUS) ×1 IMPLANT

## 2019-12-18 NOTE — Op Note (Signed)
PATIENT:  [name]  64 y.o. female  PRE-OPERATIVE DIAGNOSIS:  CHRONIC CHOLECYSTITIS  POST-OPERATIVE DIAGNOSIS:  CHRONIC CHOLECYSTITIS  PROCEDURE:  Procedure(s): LAPAROSCOPIC CHOLECYSTECTOMY  SURGEON:  Surgeon(s): Kinsinger, Arta Bruce, MD  ASSISTANT: Ahmed Prima, MD [facility]  ANESTHESIA:   local and general  Indications for procedure: [name] is a 64 y.o. female with symptoms of Abdominal pain consistent with gallbladder disease, Confirmed by Ultrasound.  Description of procedure: The patient was brought into the operative suite, placed supine. Anesthesia was administered with endotracheal tube. Patient was strapped in place and foot board was secured. All pressure points were offloaded by foam padding. The patient was prepped and draped in the usual sterile fashion.   A small incision was made to the right of the umbilicus. A 62mm trocar was inserted into the peritoneal cavity with optical entry. Pneumoperitoneum was applied with high flow low pressure. 2 77mm trocars were placed in the RUQ. An 58mm trocar was placed in the subxiphoid space. Marcaine was infused to the subxiphoid space and lateral upper right abdomen in the transversus abdominis plane. Next the patient was placed in reverse trendelenberg. Adhesions of omentum to body of gallbladder were taken down with cautery.   The gallbladder was retracted cephalad and lateral. The peritoneum was reflected off the infundibulum working lateral to medial. The cystic duct and cystic artery were identified and further dissection revealed a critical view.  The cystic duct and cystic artery were doubly clipped and ligated.   The gallbladder was removed off the liver bed with cautery. The Gallbladder was placed in a specimen bag. The gallbladder fossa was irrigated and hemostasis was applied with cautery. The gallbladder was removed via the 18mm trocar. The fascial defect was closed with interrupted 0 vicryl suture via  laparoscopic trans-fascial suture passer. Pneumoperitoneum was removed, all trocar were removed. All incisions were closed with 4-0 monocryl subcuticular stitch. The patient woke from anesthesia and was brought to PACU in stable condition. All counts were correct  Findings: Critical view obtained, evidence of chronic cholecystitis  Specimen: gallbladder  Blood loss: 5 ml  Local anesthesia: 30 ml marcaine  Complications: none  PLAN OF CARE: Discharge to home after PACU  PATIENT DISPOSITION:  PACU - hemodynamically stable.  Images: none  Ahmed Prima, MD [facility] PGY 5  General Surgery  Attending: Gurney Maxin, M.D. General, Bariatric, & Minimally Invasive Surgery [facility]

## 2019-12-18 NOTE — Transfer of Care (Signed)
Immediate Anesthesia Transfer of Care Note  Patient: [name]  Procedure(s) Performed: LAPAROSCOPIC CHOLECYSTECTOMY (N/A Abdomen)  Patient Location: PACU  Anesthesia Type:General  Level of Consciousness: awake, sedated, patient cooperative and responds to stimulation  Airway & Oxygen Therapy: Patient Spontanous Breathing and Patient connected to face mask oxygen  Post-op Assessment: Report given to RN and Post -op Vital signs reviewed and stable  Post vital signs: Reviewed and stable  Last Vitals:  Vitals Value Taken Time  BP 153/78 12/18/19 0845  Temp 36.6 C 12/18/19 0844  Pulse 82 12/18/19 0845  Resp 9 12/18/19 0845  SpO2 99 % 12/18/19 0845  Vitals shown include unvalidated device data.  Last Pain:  Vitals:   12/18/19 0604  TempSrc: Oral  PainSc: 0-No pain         Complications: No complications documented.

## 2019-12-18 NOTE — Discharge Instructions (Signed)
[facility] ______[facility] °LAPAROSCOPIC SURGERY: POST OP INSTRUCTIONS °Always review your discharge instruction sheet given to you by the facility where your surgery was performed. °IF YOU HAVE DISABILITY OR FAMILY LEAVE FORMS, YOU MUST BRING THEM TO THE OFFICE FOR PROCESSING.   °DO NOT GIVE THEM TO YOUR DOCTOR. ° °1. A prescription for pain medication may be given to you upon discharge.  Take your pain medication as prescribed, if needed.  If narcotic pain medicine is not needed, then you may take acetaminophen (Tylenol) or ibuprofen (Advil) as needed. °2. Take your usually prescribed medications unless otherwise directed. °3. If you need a refill on your pain medication, please contact your pharmacy.  They will contact our office to request authorization. Prescriptions will not be filled after 5pm or on week-ends. °4. You should follow a light diet the first few days after arrival home, such as soup and crackers, etc.  Be sure to include lots of fluids daily. °5. Most patients will experience some swelling and bruising in the area of the incisions.  Ice packs will help.  Swelling and bruising can take several days to resolve.  °6. It is common to experience some constipation if taking pain medication after surgery.  Increasing fluid intake and taking a stool softener (such as Colace) will usually help or prevent this problem from occurring.  A mild laxative (Milk of Magnesia or Miralax) should be taken according to package instructions if there are no bowel movements after 48 hours. °7. Unless discharge instructions indicate otherwise, you may remove your bandages 24-48 hours after surgery, and you may shower at that time.  You may have steri-strips (small skin tapes) in place directly over the incision.  These strips should be left on the skin for 7-10 days.  If your surgeon used skin glue on the incision, you may shower in 24 hours.  The glue will flake off over the next 2-3 weeks.  Any sutures or  staples will be removed at the office during your follow-up visit. °8. ACTIVITIES:  You may resume regular (light) daily activities beginning the next day--such as daily self-care, walking, climbing stairs--gradually increasing activities as tolerated.  You may have sexual intercourse when it is comfortable.  Refrain from any heavy lifting or straining until approved by your doctor. °a. You may drive when you are no longer taking prescription pain medication, you can comfortably wear a seatbelt, and you can safely maneuver your car and apply brakes. °b. RETURN TO WORK:  __________________________________________________________ °9. You should see your doctor in the office for a follow-up appointment approximately 2-3 weeks after your surgery.  Make sure that you call for this appointment within a day or two after you arrive home to insure a convenient appointment time. °10. OTHER INSTRUCTIONS: __________________________________________________________________________________________________________________________ __________________________________________________________________________________________________________________________ °WHEN TO CALL YOUR DOCTOR: °1. Fever over 101.0 °2. Inability to urinate °3. Continued bleeding from incision. °4. Increased pain, redness, or drainage from the incision. °5. Increasing abdominal pain ° °The clinic staff is available to answer your questions during regular business hours.  Please don’t hesitate to call and ask to speak to one of the nurses for clinical concerns.  If you have a medical emergency, go to the nearest emergency room or call 911.  A surgeon from [facility] is always on call at the hospital. °[address], [address], [place], [place]  [address] ? [address] [address], [place],    [address] °[phone] ? [phone] ? FAX [phone] °Web site:   [email or web address]  Laparoscopic Cholecystectomy, Care After This sheet gives  you information about how to care for yourself after your procedure. Your doctor may also give you more specific instructions. If you have problems or questions, contact your doctor. Follow these instructions at home: Care for cuts from surgery (incisions)   Follow instructions from your doctor about how to take care of your cuts from surgery. Make sure you: ? Wash your hands with soap and water before you change your bandage (dressing). If you cannot use soap and water, use hand sanitizer. ? Change your bandage as told by your doctor. ? Leave stitches (sutures), skin glue, or skin tape (adhesive) strips in place. They may need to stay in place for 2 weeks or longer. If tape strips get loose and curl up, you may trim the loose edges. Do not remove tape strips completely unless your doctor says it is okay.  Do not take baths, swim, or use a hot tub until your doctor says it is okay. Ask your doctor if you can take showers. You may only be allowed to take sponge baths for bathing.  Check your surgical cut area every day for signs of infection. Check for: ? More redness, swelling, or pain. ? More fluid or blood. ? Warmth. ? Pus or a bad smell. Activity  Do not drive or use heavy machinery while taking prescription pain medicine.  Do not lift anything that is heavier than 10 lb (4.5 kg) until your doctor says it is okay.  Do not play contact sports until your doctor says it is okay.  Do not drive for 24 hours if you were given a medicine to help you relax (sedative).  Rest as needed. Do not return to work or school until your doctor says it is okay. General instructions  Take over-the-counter and prescription medicines only as told by your doctor.  To prevent or treat constipation while you are taking prescription pain medicine, your doctor may recommend that you: ? Drink enough fluid to keep your pee (urine) clear or pale yellow. ? Take over-the-counter or prescription  medicines. ? Eat foods that are high in fiber, such as fresh fruits and vegetables, whole grains, and beans. ? Limit foods that are high in fat and processed sugars, such as fried and sweet foods. Contact a doctor if:  You develop a rash.  You have more redness, swelling, or pain around your surgical cuts.  You have more fluid or blood coming from your surgical cuts.  Your surgical cuts feel warm to the touch.  You have pus or a bad smell coming from your surgical cuts.  You have a fever.  One or more of your surgical cuts breaks open. Get help right away if:  You have trouble breathing.  You have chest pain.  You have pain that is getting worse in your shoulders.  You faint or feel dizzy when you stand.  You have very bad pain in your belly (abdomen).  You are sick to your stomach (nauseous) for more than one day.  You have throwing up (vomiting) that lasts for more than one day.  You have leg pain. This information is not intended to replace advice given to you by your health care provider. Make sure you discuss any questions you have with your health care provider. Document Revised: 02/02/2017 Document Reviewed: 08/09/2015 Elsevier Patient Education  [place] Instructions  Activity: Get plenty of rest for the remainder of  the day. A responsible individual must stay with you for 24 hours following the procedure.  For the next 24 hours, DO NOT: -Drive a car -Paediatric nurse -Drink alcoholic beverages -Take any medication unless instructed by your physician -Make any legal decisions or sign important papers.  Meals: Start with liquid foods such as gelatin or soup. Progress to regular foods as tolerated. Avoid greasy, spicy, heavy foods. If nausea and/or vomiting occur, drink only clear liquids until the nausea and/or vomiting subsides. Call your physician if vomiting continues.  Special Instructions/Symptoms: Your throat  may feel dry or sore from the anesthesia or the breathing tube placed in your throat during surgery. If this causes discomfort, gargle with warm salt water. The discomfort should disappear within 24 hours.

## 2019-12-18 NOTE — Anesthesia Postprocedure Evaluation (Signed)
Anesthesia Post Note  Patient: [name]  Procedure(s) Performed: LAPAROSCOPIC CHOLECYSTECTOMY (N/A Abdomen)     Patient location during evaluation: PACU Anesthesia Type: General Level of consciousness: awake and alert Pain management: pain level controlled Vital Signs Assessment: post-procedure vital signs reviewed and stable Respiratory status: spontaneous breathing, nonlabored ventilation, respiratory function stable and patient connected to nasal cannula oxygen Cardiovascular status: blood pressure returned to baseline and stable Postop Assessment: no apparent nausea or vomiting Anesthetic complications: no   No complications documented.  Last Vitals:  Vitals:   12/18/19 0915 12/18/19 0930  BP: (!) 157/80 (!) 158/84  Pulse: 85 76  Resp: 12 15  Temp:  36.6 C  SpO2: 97% 99%    Last Pain:  Vitals:   12/18/19 0914  TempSrc:   PainSc: 8                  Tiajuana Amass

## 2019-12-18 NOTE — Anesthesia Procedure Notes (Signed)
Procedure Name: Intubation Date/Time: 12/18/2019 7:38 AM Performed by: Lollie Sails, CRNA Pre-anesthesia Checklist: Patient identified, Emergency Drugs available, Suction available, Patient being monitored and Timeout performed Patient Re-evaluated:Patient Re-evaluated prior to induction Oxygen Delivery Method: Circle system utilized Preoxygenation: Pre-oxygenation with 100% oxygen Induction Type: IV induction Ventilation: Mask ventilation without difficulty Laryngoscope Size: Miller and 3 Grade View: Grade II Tube type: Oral Tube size: 7.0 mm Number of attempts: 1 Airway Equipment and Method: Stylet Placement Confirmation: ETT inserted through vocal cords under direct vision,  positive ETCO2 and breath sounds checked- equal and bilateral Secured at: 22 cm Tube secured with: Tape Dental Injury: Teeth and Oropharynx as per pre-operative assessment

## 2019-12-18 NOTE — H&P (Signed)
[name] is an 64 y.o. female.   Chief Complaint: abdominal pain HPI: 64 yo female with epigastric pain and findings consistent with biliary disease.  Past Medical History:  Diagnosis Date  . Abnormal heart rhythm    WPW pattern w/delta waves oon EKG  . Anxiety   . COVID-19 10/09/2019   sob, fatigue, all symptoms lasted  21 days in hospital all symptoms resolved  . Osteoarthritis   . Osteoporosis   . Seizures ([place])     Past Surgical History:  Procedure Laterality Date  . ABDOMINAL HYSTERECTOMY  1998  . COLONOSCOPY  last done ?  . cyst removed     right wrist  . NASAL SINUS SURGERY  15-20 yrs ago  . NM MYOCAR PERF WALL MOTION  04/2010   bruce myoview - perfusion defects (r/t breast attenuation and/or diaphragmatic attenuation), no significant ischemia, EF 72%, ECG positive for ischemia (~47mm horizontal ST depression in V3-5 w/some in V2; low risk scan  . OOPHORECTOMY  2001 or 2003  . TRANSTHORACIC ECHOCARDIOGRAM  09/2009   EF=>55%, possible mild anterior mitral leaflet thickening, trace MR; mild TR; trace pulm valve regurg    Family History  Problem Relation Age of Onset  . Rheum arthritis Mother   . Heart Problems Mother        palpitations  . Skin cancer Mother        melanoma  . Diabetes Father   . Heart disease Father        also ruptured gallbladder  . Cancer Sister        unknown type-kind was shutting organs down  . Sleep apnea Sister   . Asthma Brother        bronchitis  . Diabetes type II Brother   . Colon cancer Neg Hx   . Esophageal cancer Neg Hx   . Liver cancer Neg Hx   . Pancreatic cancer Neg Hx   . Rectal cancer Neg Hx   . Stomach cancer Neg Hx    Social History:  reports that she quit smoking about 11 years ago. Her smoking use included cigarettes. She has a 87.50 pack-year smoking history. She has never used smokeless tobacco. She reports that she does not drink alcohol and does not use drugs.  Allergies:  Allergies  Allergen Reactions  .  Linzess [Linaclotide] Diarrhea    History of seizures had a aura on medication.  . Sulfamethoxazole Swelling and Hives  . Sulfonamide Derivatives     Facility-Administered Medications Prior to Admission  Medication Dose Route Frequency Provider Last Rate Last Admin  . 0.9 %  sodium chloride infusion  500 mL Intravenous Once Nandigam, Venia Minks, MD       Medications Prior to Admission  Medication Sig Dispense Refill  . ALPRAZolam (XANAX) 1 MG tablet Take 1 mg by mouth 2 (two) times daily.    Marland Kitchen PHENobarbital (LUMINAL) 64.8 MG tablet Take 194.4 mg by mouth at bedtime. Takes 3 tabs at 2018      Results for orders placed or performed during the hospital encounter of 12/18/19 (from the past 48 hour(s))  I-STAT, chem 8     Status: None   Collection Time: 12/18/19  6:34 AM  Result Value Ref Range   Sodium 140 135 - 145 mmol/L   Potassium 4.2 3.5 - 5.1 mmol/L   Chloride 104 98 - 111 mmol/L   BUN 13 8 - 23 mg/dL   Creatinine, Ser 0.70 0.44 - 1.00 mg/dL  Glucose, Bld 92 70 - 99 mg/dL    Comment: Glucose reference range applies only to samples taken after fasting for at least 8 hours.   Calcium, Ion 1.24 1.15 - 1.40 mmol/L   TCO2 26 22 - 32 mmol/L   Hemoglobin 13.3 12.0 - 15.0 g/dL   HCT 39.0 36 - 46 %   No results found.  Review of Systems  Constitutional: Negative for chills and fever.  HENT: Negative for hearing loss.   Respiratory: Negative for cough.   Cardiovascular: Negative for chest pain and palpitations.  Gastrointestinal: Positive for abdominal pain and nausea. Negative for vomiting.  Genitourinary: Negative for dysuria and urgency.  Musculoskeletal: Negative for myalgias and neck pain.  Skin: Negative for rash.  Neurological: Negative for dizziness and headaches.  Hematological: Does not bruise/bleed easily.  Psychiatric/Behavioral: Negative for suicidal ideas.    Blood pressure 115/73, pulse 72, temperature (!) 97 F (36.1 C), temperature source Oral, resp. rate 16,  height 5\' 5"  (1.651 m), weight 69.7 kg, SpO2 100 %. Physical Exam Vitals and nursing note reviewed.  Constitutional:      Appearance: She is well-developed.  HENT:     Head: Normocephalic and atraumatic.  Eyes:     General: No scleral icterus.    Conjunctiva/sclera: Conjunctivae normal.  Cardiovascular:     Rate and Rhythm: Normal rate and regular rhythm.  Pulmonary:     Effort: Pulmonary effort is normal.     Breath sounds: Normal breath sounds. No wheezing or rales.  Chest:     Chest wall: No tenderness.  Abdominal:     General: There is no distension.     Palpations: Abdomen is soft.     Tenderness: There is no abdominal tenderness. There is no rebound.  Musculoskeletal:        General: Normal range of motion.     Cervical back: Normal range of motion and neck supple.  Skin:    General: Skin is warm and dry.  Neurological:     Mental Status: She is alert and oriented to person, place, and time.  Psychiatric:        Behavior: Behavior normal.      Assessment/Plan 63 yo female with biliary colic -lap chole -ERAS protocol -outpatient protocol  Mickeal Skinner, MD 12/18/2019, 7:22 AM

## 2019-12-19 ENCOUNTER — Encounter (HOSPITAL_BASED_OUTPATIENT_CLINIC_OR_DEPARTMENT_OTHER): Payer: Self-pay | Admitting: General Surgery

## 2019-12-19 LAB — SURGICAL PATHOLOGY

## 2023-10-05 LAB — COLOGUARD: COLOGUARD: NEGATIVE
